# Patient Record
Sex: Female | Born: 1965 | Race: White | Hispanic: No | Marital: Married | Smoking: Never smoker
Health system: Southern US, Community
[De-identification: ages and names within clinical notes are randomized; demographics above are authoritative.]

## PROBLEM LIST (undated history)

## (undated) DIAGNOSIS — R112 Nausea with vomiting, unspecified: Secondary | ICD-10-CM

## (undated) DIAGNOSIS — K649 Unspecified hemorrhoids: Secondary | ICD-10-CM

## (undated) DIAGNOSIS — E119 Type 2 diabetes mellitus without complications: Secondary | ICD-10-CM

## (undated) DIAGNOSIS — Z9889 Other specified postprocedural states: Secondary | ICD-10-CM

## (undated) DIAGNOSIS — K921 Melena: Secondary | ICD-10-CM

## (undated) DIAGNOSIS — E079 Disorder of thyroid, unspecified: Secondary | ICD-10-CM

## (undated) HISTORY — DX: Disorder of thyroid, unspecified: E07.9

## (undated) HISTORY — DX: Unspecified hemorrhoids: K64.9

## (undated) HISTORY — DX: Melena: K92.1

## (undated) HISTORY — DX: Type 2 diabetes mellitus without complications: E11.9

## (undated) HISTORY — PX: OTHER SURGICAL HISTORY: SHX169

---

## 2000-09-24 ENCOUNTER — Other Ambulatory Visit: Admission: RE | Admit: 2000-09-24 | Discharge: 2000-09-24 | Payer: Self-pay | Admitting: *Deleted

## 2000-09-28 ENCOUNTER — Encounter: Admission: RE | Admit: 2000-09-28 | Discharge: 2000-09-28 | Payer: Self-pay | Admitting: Family Medicine

## 2000-09-28 ENCOUNTER — Encounter: Payer: Self-pay | Admitting: Family Medicine

## 2002-03-08 ENCOUNTER — Other Ambulatory Visit: Admission: RE | Admit: 2002-03-08 | Discharge: 2002-03-08 | Payer: Self-pay | Admitting: *Deleted

## 2010-04-22 ENCOUNTER — Emergency Department: Payer: Self-pay | Admitting: Emergency Medicine

## 2011-08-21 ENCOUNTER — Emergency Department (HOSPITAL_COMMUNITY)

## 2011-08-21 ENCOUNTER — Emergency Department (HOSPITAL_COMMUNITY)
Admission: EM | Admit: 2011-08-21 | Discharge: 2011-08-21 | Disposition: A | Discharge status: Home / Self Care | Attending: Emergency Medicine | Admitting: Emergency Medicine

## 2011-08-21 DIAGNOSIS — W292XXA Contact with other powered household machinery, initial encounter: Secondary | ICD-10-CM | POA: Insufficient documentation

## 2011-08-21 DIAGNOSIS — S62639B Displaced fracture of distal phalanx of unspecified finger, initial encounter for open fracture: Secondary | ICD-10-CM | POA: Insufficient documentation

## 2011-08-21 DIAGNOSIS — S61209A Unspecified open wound of unspecified finger without damage to nail, initial encounter: Secondary | ICD-10-CM | POA: Insufficient documentation

## 2011-08-28 NOTE — Consult Note (Signed)
NAME:  Alexis, Patel                ACCOUNT NO.:  [id number]  MEDICAL RECORD NO.:  [id number]  LOCATION:  [facility]                         FACILITY:  [facility]  PHYSICIAN:  Betha Loa, MD        DATE OF BIRTH:  20-Sep-1966  DATE OF CONSULTATION:  08/21/2011 DATE OF DISCHARGE:  08/21/2011                                CONSULTATION   Consult is from emergency department consult for left thumb tip lacerations and nail bed injury.  HISTORY:  Ms. Alexis Patel is a 45 year old left-hand dominant female who states she was chopping rosemary using a stick blender when she was trying to clean some rosemary off the blade and accidentally hit the button lacerating the left thumb.  She came to the emergency department, was evaluated and I was consulted for the management of injuries.  She reports no previous injuries to the left thumb with the exception of a laceration as a teen which had no residual issues.  She reports no other injuries at this time.  ALLERGIES:  No known drug allergies.  PAST MEDICAL HISTORY:  None.  PAST SURGICAL HISTORY:  None.  MEDICATIONS:  None.  FAMILY HISTORY:  Positive for diabetes, hypertension, cholesterol.  SOCIAL HISTORY:  Alexis Patel does not smoke and does not use alcohol.  REVIEW OF SYSTEMS:  Thirteen point review of systems negative.  PHYSICAL EXAMINATION:  VITAL SIGNS:  Temperature 98.6, pulse 98, respirations 16, BP 149/78. GENERAL:  She is alert and oriented x3, well developed, well nourished. She is resting comfortably in hospital stretcher. EXTREMITIES:  Bilateral upper extremities are intact to light touch sensation & capillary refill in all fingertips.  She can flex & extend the IP joint of her thumbs and cross her fingers.  Right upper extremity is without wounds, without tenderness to palpation.  Left upper extremity with the exception of thumb has no wounds, no tenderness to palpation. In the thumb, she has laceration on the pad of the finger as well as  one on the radial side going into the nail bed.  She has intact sensation, capillary refill distally.  There is no gross contamination.  She can flex & extend the IP joint of the thumb.  RADIOGRAPHS:  AP lateral and oblique views of the thumb show a small tuft fracture.  ASSESSMENT AND PLAN:  Left thumb tip laceration.  I discussed with Alexis Patel the nature of the injury.  I recommended repair of the lacerations under digital block in the emergency department.  Risks, benefits, and alternatives of the procedure were discussed including risk of blood loss, infection, damage to nerves, vessels, tendons, ligaments, bone, failure to procedure, need for additional procedures, complications with wound healing, continued pain, and nail deformity. She voiced understanding of these risks and elected to proceed.  PROCEDURE NOTE:  Digital block was performed with 10 mL of half and half solution of 2% plain lidocaine and 0.5% plain Marcaine.  This was adequate to give total digital anesthesia to the left thumb.  The thumb was prepped with Betadine and draped with sterile towels.  This had been copiously irrigated with a 1000 mL of sterile saline and Betadine solution.  The nail  was removed using a freer elevator.  The lacerations were repaired using 5-0 Monocryl suture in an interrupted fashion.  The nail bed was repaired using 6-0 chromic suture in an interrupted fashion.  This apposed all tissues well.  A piece of Xeroform was placed in the nail fold.  The wounds were all dressed with sterile Xeroform and 2 x 2's and wrapped with Kling and a Coban dressing.  Penrose drain has been used as a tourniquet was up for approximately 20 minutes.  I will give her Percocet 5/325 one to two p.o. q.6 hours p.r.n. pain dispensed #40 and Bactrim DS 1 p.o. b.i.d. x7 days.  She will be seen in follow up in the office in 1 week.     Betha Loa, MD     KK/MEDQ  D:  08/21/2011  T:  08/21/2011  Job:   [id number]  Electronically Signed by Betha Loa  on 08/28/2011 01:51:15 PM

## 2015-04-09 ENCOUNTER — Other Ambulatory Visit: Payer: Self-pay

## 2015-04-09 DIAGNOSIS — Z1231 Encounter for screening mammogram for malignant neoplasm of breast: Secondary | ICD-10-CM

## 2015-04-30 ENCOUNTER — Ambulatory Visit
Admission: RE | Admit: 2015-04-30 | Discharge: 2015-04-30 | Disposition: A | Discharge status: Home / Self Care | Source: Ambulatory Visit

## 2015-04-30 DIAGNOSIS — Z1231 Encounter for screening mammogram for malignant neoplasm of breast: Secondary | ICD-10-CM

## 2015-05-07 ENCOUNTER — Other Ambulatory Visit: Payer: Self-pay | Admitting: Nurse Practitioner

## 2015-05-07 DIAGNOSIS — R928 Other abnormal and inconclusive findings on diagnostic imaging of breast: Secondary | ICD-10-CM

## 2015-05-15 ENCOUNTER — Ambulatory Visit
Admission: RE | Admit: 2015-05-15 | Discharge: 2015-05-15 | Disposition: A | Discharge status: Home / Self Care | Source: Ambulatory Visit | Attending: Nurse Practitioner | Admitting: Nurse Practitioner

## 2015-05-15 DIAGNOSIS — R928 Other abnormal and inconclusive findings on diagnostic imaging of breast: Secondary | ICD-10-CM

## 2015-05-20 ENCOUNTER — Encounter

## 2015-05-20 ENCOUNTER — Other Ambulatory Visit

## 2015-11-27 ENCOUNTER — Other Ambulatory Visit: Payer: Self-pay | Admitting: Otolaryngology

## 2015-11-27 DIAGNOSIS — E049 Nontoxic goiter, unspecified: Secondary | ICD-10-CM

## 2015-12-02 ENCOUNTER — Ambulatory Visit
Admission: RE | Admit: 2015-12-02 | Discharge: 2015-12-02 | Disposition: A | Discharge status: Home / Self Care | Source: Ambulatory Visit | Attending: Otolaryngology | Admitting: Otolaryngology

## 2015-12-02 DIAGNOSIS — E049 Nontoxic goiter, unspecified: Secondary | ICD-10-CM

## 2016-04-13 ENCOUNTER — Other Ambulatory Visit: Payer: Self-pay | Admitting: Nurse Practitioner

## 2016-04-13 DIAGNOSIS — Z1231 Encounter for screening mammogram for malignant neoplasm of breast: Secondary | ICD-10-CM

## 2016-04-30 ENCOUNTER — Ambulatory Visit
Admission: RE | Admit: 2016-04-30 | Discharge: 2016-04-30 | Disposition: A | Discharge status: Home / Self Care | Source: Ambulatory Visit | Attending: Nurse Practitioner | Admitting: Nurse Practitioner

## 2016-04-30 DIAGNOSIS — Z1231 Encounter for screening mammogram for malignant neoplasm of breast: Secondary | ICD-10-CM

## 2016-06-07 ENCOUNTER — Encounter (HOSPITAL_COMMUNITY): Payer: Self-pay | Admitting: Emergency Medicine

## 2016-06-07 ENCOUNTER — Emergency Department (HOSPITAL_COMMUNITY)
Admission: EM | Admit: 2016-06-07 | Discharge: 2016-06-07 | Disposition: A | Discharge status: Home / Self Care | Attending: Emergency Medicine | Admitting: Emergency Medicine

## 2016-06-07 DIAGNOSIS — R109 Unspecified abdominal pain: Secondary | ICD-10-CM

## 2016-06-07 DIAGNOSIS — Y9289 Other specified places as the place of occurrence of the external cause: Secondary | ICD-10-CM | POA: Diagnosis not present

## 2016-06-07 DIAGNOSIS — W57XXXA Bitten or stung by nonvenomous insect and other nonvenomous arthropods, initial encounter: Secondary | ICD-10-CM | POA: Insufficient documentation

## 2016-06-07 DIAGNOSIS — Y9389 Activity, other specified: Secondary | ICD-10-CM | POA: Diagnosis not present

## 2016-06-07 DIAGNOSIS — Y999 Unspecified external cause status: Secondary | ICD-10-CM | POA: Insufficient documentation

## 2016-06-07 DIAGNOSIS — S30861A Insect bite (nonvenomous) of abdominal wall, initial encounter: Secondary | ICD-10-CM | POA: Insufficient documentation

## 2016-06-07 LAB — COMPREHENSIVE METABOLIC PANEL
ALBUMIN: 3.7 g/dL (ref 3.5–5.0)
ALT: 17 U/L (ref 14–54)
ANION GAP: 8 (ref 5–15)
AST: 17 U/L (ref 15–41)
Alkaline Phosphatase: 82 U/L (ref 38–126)
BUN: 11 mg/dL (ref 6–20)
CHLORIDE: 104 mmol/L (ref 101–111)
CO2: 25 mmol/L (ref 22–32)
Calcium: 9.1 mg/dL (ref 8.9–10.3)
Creatinine, Ser: 0.77 mg/dL (ref 0.44–1.00)
GFR calc non Af Amer: >60 mL/min (ref >60)
GLUCOSE: 138 mg/dL — AB (ref 65–99)
POTASSIUM: 3.3 mmol/L — AB (ref 3.5–5.1)
Sodium: 137 mmol/L (ref 135–145)
Total Bilirubin: 0.3 mg/dL (ref 0.3–1.2)
Total Protein: 7.7 g/dL (ref 6.5–8.1)

## 2016-06-07 LAB — CBC
HEMATOCRIT: 38.1 % (ref 36.0–46.0)
HEMOGLOBIN: 12.2 g/dL (ref 12.0–15.0)
MCH: 27.4 pg (ref 26.0–34.0)
MCHC: 32 g/dL (ref 30.0–36.0)
MCV: 85.4 fL (ref 78.0–100.0)
Platelets: 319 10*3/uL (ref 150–400)
RBC: 4.46 MIL/uL (ref 3.87–5.11)
RDW: 14 % (ref 11.5–15.5)
WBC: 9.8 10*3/uL (ref 4.0–10.5)

## 2016-06-07 LAB — URINALYSIS, ROUTINE W REFLEX MICROSCOPIC
BILIRUBIN URINE: NEGATIVE
Glucose, UA: NEGATIVE mg/dL
Ketones, ur: NEGATIVE mg/dL
Leukocytes, UA: NEGATIVE
Nitrite: NEGATIVE
PH: 6 (ref 5.0–8.0)
Protein, ur: 30 mg/dL — AB
SPECIFIC GRAVITY, URINE: 1.024 (ref 1.005–1.030)

## 2016-06-07 LAB — URINE MICROSCOPIC-ADD ON

## 2016-06-07 LAB — LIPASE, BLOOD: LIPASE: 20 U/L (ref 11–51)

## 2016-06-07 NOTE — ED Provider Notes (Signed)
[facility] Provider Note   CSN: [id number] Arrival date & time: 06/07/16  4098  First Provider Contact:  First MD Initiated Contact with Patient 06/07/16 0631        History   Chief Complaint Chief Complaint  Patient presents with  . Abdominal Pain    HPI Alexis Patel is a 50 y.o. Otherwise healthy female, who presents to the ED with complaints of 12 hours of right upper quadrant abdominal pain. She describes the pain as 1/10 constant aching nonradiating pain with no known aggravating or alleviating factors given that she has not tried anything for pain. She reports associated symptoms including a swollen area to the skin of the right upper quadrant region. She states the symptoms began around 6 PM, and reports that she had been mowing her lawn yesterday and is unsure whether she may have been bit by an insect. She states the skin is mildly pink but denies any erythema. Denies any skin warmth, rashes, itching, fevers, chills, chest pain, shortness breath, nausea, vomiting, diarrhea, constipation, obstipation, melena, hematochezia, dysuria, hematuria, vaginal discharge, numbness, tingling, weakness, recent travel, sick contacts, suspicious food intake, alcohol use, or chronic NSAID use. No prior abd surgeries. Currently on menses.    The history is provided by the patient and medical records. No language interpreter was used.  Abdominal Pain   This is a new problem. The current episode started 12 to 24 hours ago. The problem occurs constantly. The problem has not changed since onset.The pain is associated with an unknown factor. The pain is located in the RUQ. The pain is at a severity of 1/10. The pain is mild. Pertinent negatives include fever, diarrhea, flatus, hematochezia, melena, nausea, vomiting, constipation, dysuria, hematuria, arthralgias and myalgias. Nothing aggravates the symptoms. Nothing relieves the symptoms.    History reviewed. No pertinent past medical  history.  There are no active problems to display for this patient.   History reviewed. No pertinent surgical history.  OB History    No data available       Home Medications    Prior to Admission medications   Not on File    Family History No family history on file.  Social History Social History  Substance Use Topics  . Smoking status: Never Smoker  . Smokeless tobacco: Not on file  . Alcohol use No     Allergies   Codeine   Review of Systems Review of Systems  Constitutional: Negative for chills and fever.  Respiratory: Negative for shortness of breath.   Cardiovascular: Negative for chest pain.  Gastrointestinal: Positive for abdominal pain. Negative for blood in stool, constipation, diarrhea, flatus, hematochezia, melena, nausea and vomiting.  Genitourinary: Negative for dysuria, hematuria, vaginal bleeding and vaginal discharge.  Musculoskeletal: Negative for arthralgias and myalgias.  Skin: Positive for color change (pinkish swollen area to R lateral abdomen). Negative for rash and wound.  Allergic/Immunologic: Negative for immunocompromised state.  Neurological: Negative for weakness and numbness.  Psychiatric/Behavioral: Negative for confusion.   10 Systems reviewed and are negative for acute change except as noted in the HPI.   Physical Exam Updated Vital Signs BP 169/88 (BP Location: Left Arm)   Pulse 93   Temp 98.2 F (36.8 C) (Oral)   Resp 16   Ht  (1.6 m)   Wt 107.5 kg   SpO2 99%   BMI 41.98 kg/m   Physical Exam  Constitutional: She is oriented to person, place, and time. Vital signs are normal. She appears  well-developed and well-nourished.  Non-toxic appearance. No distress.  Afebrile, nontoxic, NAD  HENT:  Head: Normocephalic and atraumatic.  Mouth/Throat: Oropharynx is clear and moist and mucous membranes are normal.  Eyes: Conjunctivae and EOM are normal. Right eye exhibits no discharge. Left eye exhibits no discharge.   Neck: Normal range of motion. Neck supple.  Cardiovascular: Normal rate, regular rhythm, normal heart sounds and intact distal pulses.  Exam reveals no gallop and no friction rub.   No murmur heard. Pulmonary/Chest: Effort normal and breath sounds normal. No respiratory distress. She has no decreased breath sounds. She has no wheezes. She has no rhonchi. She has no rales.  Abdominal: Soft. Normal appearance and bowel sounds are normal. She exhibits no distension. There is no tenderness. There is no rigidity, no rebound, no guarding, no CVA tenderness, no tenderness at McBurney's point and negative Murphy's sign.    Soft, NTND, +BS throughout, no r/g/r, neg murphy's, neg mcburney's, no CVA TTP Small area of pinkish red skin that appears mildly swollen over the R lateral abdomen just below the costal margin, no induration or warmth, no cellulitic changes, no abscesses  Musculoskeletal: Normal range of motion.  Neurological: She is alert and oriented to person, place, and time. She has normal strength. No sensory deficit.  Skin: Skin is warm, dry and intact. No rash noted.  R lateral abdomen skin swelling/erythema as noted above  Psychiatric: She has a normal mood and affect.  Nursing note and vitals reviewed.    ED Treatments / Results  Labs (all labs ordered are listed, but only abnormal results are displayed) Labs Reviewed  COMPREHENSIVE METABOLIC PANEL - Abnormal; Notable for the following:       Result Value   Potassium 3.3 (*)    Glucose, Bld 138 (*)    All other components within normal limits  URINALYSIS, ROUTINE W REFLEX MICROSCOPIC (NOT AT [facility]) - Abnormal; Notable for the following:    Hgb urine dipstick MODERATE (*)    Protein, ur 30 (*)    All other components within normal limits  URINE MICROSCOPIC-ADD ON - Abnormal; Notable for the following:    Squamous Epithelial / LPF 0-5 (*)    Bacteria, UA RARE (*)    Crystals CA OXALATE CRYSTALS (*)    All other components  within normal limits  LIPASE, BLOOD  CBC    EKG  EKG Interpretation  Date/Time:  Sunday June 07 2016 02:49:47 EDT Ventricular Rate:  86 PR Interval:  144 QRS Duration: 82 QT Interval:  390 QTC Calculation: 466 R Axis:   56 Text Interpretation:  Normal sinus rhythm Cannot rule out Anterior infarct , age undetermined Abnormal ECG No old tracing to compare Confirmed by [facility]  MD, DAVID ([id number]) on 06/07/2016 6:32:19 AM       Radiology No results found.  Procedures Procedures (including critical care time)  Medications Ordered in ED Medications - No data to display   Initial Impression / Assessment and Plan / ED Course  I have reviewed the triage vital signs and the nursing notes.  Pertinent labs & imaging results that were available during my care of the patient were reviewed by me and considered in my medical decision making (see chart for details).  Clinical Course    50 y.o. female here with RUQ/R lateral abd pain x12 hrs and swollen area. Unknown if insect bit her, states she was mowing the lawn yesterday. Area mildly pinkish red, no warmth, no cellulitic appearance. No tenderness on  exam. Neg murphy's. Labs unremarkable. Appears to be superficial area of possible allergic reaction to an insect bite, doubt cellulitis or superimposed infection. Discussed symptomatic care at home, close PCP f/up in 3 days to check area for s/sx of infection. Discussed strict return precautions regarding worsening infection symptoms, or allergic rxn symptoms. Airway intact, NAD, doubt need for further work up/evaluation emergently. I explained the diagnosis and have given explicit precautions to return to the ER including for any other new or worsening symptoms. The patient understands and accepts the medical plan as it's been dictated and I have answered their questions. Discharge instructions concerning home care and prescriptions have been given. The patient is STABLE and is discharged to home in  good condition.   Final Clinical Impressions(s) / ED Diagnoses   Final diagnoses:  Right lateral abdominal pain  Insect bite    New Prescriptions New Prescriptions   No medications on file     Mason Camprubi-Soms, PA-C 06/07/16 4098    Dione Booze, MD 06/07/16 [phone]

## 2016-06-07 NOTE — Discharge Instructions (Signed)
Use tylenol and motrin as needed for pain. Use antihistamines like benadryl/zyrtec/claritin/etc to help with itching/swelling. Follow up with your regular doctor in 3 days for recheck of symptoms. Use a warm compress to the area to help with pain/swelling. Return to the ER for changes or worsening symptoms.

## 2016-06-07 NOTE — ED Triage Notes (Signed)
Pt. reports right upper abdominal discomfort / mild aches onset last night , denies  injury , no emesis or fever .

## 2017-06-15 ENCOUNTER — Ambulatory Visit (INDEPENDENT_AMBULATORY_CARE_PROVIDER_SITE_OTHER): Admitting: Primary Care

## 2017-06-15 ENCOUNTER — Encounter: Payer: Self-pay | Admitting: Primary Care

## 2017-06-15 VITALS — BP 148/86 | HR 90 | Temp 98.2°F | Ht 63.0 in | Wt 228.8 lb

## 2017-06-15 DIAGNOSIS — Z1231 Encounter for screening mammogram for malignant neoplasm of breast: Secondary | ICD-10-CM | POA: Diagnosis not present

## 2017-06-15 DIAGNOSIS — E049 Nontoxic goiter, unspecified: Secondary | ICD-10-CM

## 2017-06-15 DIAGNOSIS — R030 Elevated blood-pressure reading, without diagnosis of hypertension: Secondary | ICD-10-CM

## 2017-06-15 DIAGNOSIS — Z1239 Encounter for other screening for malignant neoplasm of breast: Secondary | ICD-10-CM

## 2017-06-15 LAB — COMPREHENSIVE METABOLIC PANEL
ALT: 25 U/L (ref 0–35)
AST: 21 U/L (ref 0–37)
Albumin: 4.2 g/dL (ref 3.5–5.2)
Alkaline Phosphatase: 81 U/L (ref 39–117)
BILIRUBIN TOTAL: 0.4 mg/dL (ref 0.2–1.2)
BUN: 10 mg/dL (ref 6–23)
CO2: 25 meq/L (ref 19–32)
Calcium: 9.4 mg/dL (ref 8.4–10.5)
Chloride: 100 mEq/L (ref 96–112)
Creatinine, Ser: 0.7 mg/dL (ref 0.40–1.20)
GFR: 93.07 mL/min (ref >60.00)
GLUCOSE: 105 mg/dL — AB (ref 70–99)
Potassium: 3.7 mEq/L (ref 3.5–5.1)
Sodium: 134 mEq/L — ABNORMAL LOW (ref 135–145)
Total Protein: 8.4 g/dL — ABNORMAL HIGH (ref 6.0–8.3)

## 2017-06-15 LAB — T4, FREE: FREE T4: 1.05 ng/dL (ref 0.60–1.60)

## 2017-06-15 LAB — TSH: TSH: 1.37 u[IU]/mL (ref 0.35–4.50)

## 2017-06-15 NOTE — Patient Instructions (Signed)
Complete lab work prior to leaving today. I will notify you of your results once received.   Start monitoring your blood pressure several times weekly, around the same time of day. Ensure that you have rested for 30 minutes prior to checking your blood pressure. Record your readings and bring them to your next visit.  Please schedule a physical with me in 2 months. You may also schedule a lab only appointment 3-4 days prior. We will discuss your lab results in detail during your physical.  It was a pleasure to meet you today! Please don't hesitate to call me with any questions. Welcome to [facility]!   DASH Eating Plan DASH stands for "Dietary Approaches to Stop Hypertension." The DASH eating plan is a healthy eating plan that has been shown to reduce high blood pressure (hypertension). It may also reduce your risk for type 2 diabetes, heart disease, and stroke. The DASH eating plan may also help with weight loss. What are tips for following this plan? General guidelines  Avoid eating more than 2,300 mg (milligrams) of salt (sodium) a day. If you have hypertension, you may need to reduce your sodium intake to 1,500 mg a day.  Limit alcohol intake to no more than 1 drink a day for nonpregnant women and 2 drinks a day for men. One drink equals 12 oz of beer, 5 oz of wine, or 1 oz of hard liquor.  Work with your health care provider to maintain a healthy body weight or to lose weight. Ask what an ideal weight is for you.  Get at least 30 minutes of exercise that causes your heart to beat faster (aerobic exercise) most days of the week. Activities may include walking, swimming, or biking.  Work with your health care provider or diet and nutrition specialist (dietitian) to adjust your eating plan to your individual calorie needs. Reading food labels  Check food labels for the amount of sodium per serving. Choose foods with less than 5 percent of the Daily Value of sodium. Generally, foods with  less than 300 mg of sodium per serving fit into this eating plan.  To find whole grains, look for the word "whole" as the first word in the ingredient list. Shopping  Buy products labeled as "low-sodium" or "no salt added."  Buy fresh foods. Avoid canned foods and premade or frozen meals. Cooking  Avoid adding salt when cooking. Use salt-free seasonings or herbs instead of table salt or sea salt. Check with your health care provider or pharmacist before using salt substitutes.  Do not fry foods. Cook foods using healthy methods such as baking, boiling, grilling, and broiling instead.  Cook with heart-healthy oils, such as olive, canola, soybean, or sunflower oil. Meal planning   Eat a balanced diet that includes: ? 5 or more servings of fruits and vegetables each day. At each meal, try to fill half of your plate with fruits and vegetables. ? Up to 6-8 servings of whole grains each day. ? Less than 6 oz of lean meat, poultry, or fish each day. A 3-oz serving of meat is about the same size as a deck of cards. One egg equals 1 oz. ? 2 servings of low-fat dairy each day. ? A serving of nuts, seeds, or beans 5 times each week. ? Heart-healthy fats. Healthy fats called Omega-3 fatty acids are found in foods such as flaxseeds and coldwater fish, like sardines, salmon, and mackerel.  Limit how much you eat of the following: ? Canned  or prepackaged foods. ? Food that is high in trans fat, such as fried foods. ? Food that is high in saturated fat, such as fatty meat. ? Sweets, desserts, sugary drinks, and other foods with added sugar. ? Full-fat dairy products.  Do not salt foods before eating.  Try to eat at least 2 vegetarian meals each week.  Eat more home-cooked food and less restaurant, buffet, and fast food.  When eating at a restaurant, ask that your food be prepared with less salt or no salt, if possible. What foods are recommended? The items listed may not be a complete list.  Talk with your dietitian about what dietary choices are best for you. Grains Whole-grain or whole-wheat bread. Whole-grain or whole-wheat pasta. Brown rice. Orpah Cobb. Bulgur. Whole-grain and low-sodium cereals. Pita bread. Low-fat, low-sodium crackers. Whole-wheat flour tortillas. Vegetables Fresh or frozen vegetables (raw, steamed, roasted, or grilled). Low-sodium or reduced-sodium tomato and vegetable juice. Low-sodium or reduced-sodium tomato sauce and tomato paste. Low-sodium or reduced-sodium canned vegetables. Fruits All fresh, dried, or frozen fruit. Canned fruit in natural juice (without added sugar). Meat and other protein foods Skinless chicken or [place]. Ground chicken or [place]. Pork with fat trimmed off. Fish and seafood. Egg whites. Dried beans, peas, or lentils. Unsalted nuts, nut butters, and seeds. Unsalted canned beans. Lean cuts of beef with fat trimmed off. Low-sodium, lean deli meat. Dairy Low-fat (1%) or fat-free (skim) milk. Fat-free, low-fat, or reduced-fat cheeses. Nonfat, low-sodium ricotta or cottage cheese. Low-fat or nonfat yogurt. Low-fat, low-sodium cheese. Fats and oils Soft margarine without trans fats. Vegetable oil. Low-fat, reduced-fat, or light mayonnaise and salad dressings (reduced-sodium). Canola, safflower, olive, soybean, and sunflower oils. Avocado. Seasoning and other foods Herbs. Spices. Seasoning mixes without salt. Unsalted popcorn and pretzels. Fat-free sweets. What foods are not recommended? The items listed may not be a complete list. Talk with your dietitian about what dietary choices are best for you. Grains Baked goods made with fat, such as croissants, muffins, or some breads. Dry pasta or rice meal packs. Vegetables Creamed or fried vegetables. Vegetables in a cheese sauce. Regular canned vegetables (not low-sodium or reduced-sodium). Regular canned tomato sauce and paste (not low-sodium or reduced-sodium). Regular tomato and vegetable  juice (not low-sodium or reduced-sodium). Rosita Fire. Olives. Fruits Canned fruit in a light or heavy syrup. Fried fruit. Fruit in cream or butter sauce. Meat and other protein foods Fatty cuts of meat. Ribs. Fried meat. Tomasa Blase. Sausage. Bologna and other processed lunch meats. Salami. Fatback. Hotdogs. Bratwurst. Salted nuts and seeds. Canned beans with added salt. Canned or smoked fish. Whole eggs or egg yolks. Chicken or [place] with skin. Dairy Whole or 2% milk, cream, and half-and-half. Whole or full-fat cream cheese. Whole-fat or sweetened yogurt. Full-fat cheese. Nondairy creamers. Whipped toppings. Processed cheese and cheese spreads. Fats and oils Butter. Stick margarine. Lard. Shortening. Ghee. Bacon fat. Tropical oils, such as coconut, palm kernel, or palm oil. Seasoning and other foods Salted popcorn and pretzels. Onion salt, garlic salt, seasoned salt, table salt, and sea salt. Worcestershire sauce. Tartar sauce. Barbecue sauce. Teriyaki sauce. Soy sauce, including reduced-sodium. Steak sauce. Canned and packaged gravies. Fish sauce. Oyster sauce. Cocktail sauce. Horseradish that you find on the shelf. Ketchup. Mustard. Meat flavorings and tenderizers. Bouillon cubes. Hot sauce and Tabasco sauce. Premade or packaged marinades. Premade or packaged taco seasonings. Relishes. Regular salad dressings. Where to find more information:  National Heart, Lung, and Blood Institute: PopSteam.is  American Heart Association: www.heart.org Summary  The  DASH eating plan is a healthy eating plan that has been shown to reduce high blood pressure (hypertension). It may also reduce your risk for type 2 diabetes, heart disease, and stroke.  With the DASH eating plan, you should limit salt (sodium) intake to 2,300 mg a day. If you have hypertension, you may need to reduce your sodium intake to 1,500 mg a day.  When on the DASH eating plan, aim to eat more fresh fruits and vegetables, whole grains,  lean proteins, low-fat dairy, and heart-healthy fats.  Work with your health care provider or diet and nutrition specialist (dietitian) to adjust your eating plan to your individual calorie needs. This information is not intended to replace advice given to you by your health care provider. Make sure you discuss any questions you have with your health care provider. Document Released: 10/15/2011 Document Revised: 10/19/2016 Document Reviewed: 10/19/2016 Elsevier Interactive Patient Education  2017 [employer].

## 2017-06-15 NOTE — Progress Notes (Signed)
   Subjective:    Patient ID: Real Alexis Patel, female    DOB: 02-15-1966, 51 y.o.   MRN: [id number]  HPI  Alexis Patel is a 51 year old female who presents today to establish care and discuss the problems mentioned below. Will obtain old records. Her last physical was several years ago. She is due for a mammogram, family history of breast cancer in her mother.  1) Enlarged Thyroid: Evaluated per her ENT several weeks ago who noticed that her thyroid was enlarged. This was evident on ultrasound. She has no history of hypothyroidism and has never had labs. Family history of Hashimoto's Thyroiditis in both sisters. She's noticed fullness to her throat over the past 10 years. She denies hair loss, palpitations, cold intolerance. She does endorse fatigue, brittle nails.   2) Elevated Blood Pressure Reading: Endorses elevated readings during last several sick visits, most of these visits were for acute sinusitis. She's not check her BP at home. She denies dizziness, chest pain, visual changes. Her husband does have a blood pressure cuff. She has recently lost 8 pounds through a new low carbohydrate diet.   Review of Systems  Constitutional: Positive for fatigue.  Eyes: Negative for visual disturbance.  Respiratory: Negative for shortness of breath.   Cardiovascular: Negative for chest pain.  Endocrine: Negative for cold intolerance.       Thyroid enlargement  Neurological: Negative for dizziness and headaches.       Past Medical History:  Diagnosis Date  . Blood in stool   . Hemorrhoids   . Thyroid disease      Social History   Social History  . Marital status: Married    Spouse name: N/A  . Number of children: N/A  . Years of education: N/A   Occupational History  . Not on file.   Social History Main Topics  . Smoking status: Never Smoker  . Smokeless tobacco: Never Used  . Alcohol use No  . Drug use: No  . Sexual activity: Not on file   Other Topics Concern  . Not on file    Social History Narrative   Married.   2 children. 6 grandchildren.   Works as a Futures traderhomemaker.   Enjoys making handmade soaps, spending time with family.     No past surgical history on file.  Family History  Problem Relation Age of Onset  . Breast cancer Mother   . Arthritis Father   . Hyperlipidemia Father   . Stroke Father   . Hypertension Father   . Arthritis Sister   . Hyperlipidemia Sister   . Diabetes Sister     Allergies  Allergen Reactions  . Codeine     No current outpatient prescriptions on file prior to visit.   No current facility-administered medications on file prior to visit.     BP (!) 148/86   Pulse 90   Temp 98.2 F (36.8 C) (Oral)   Ht 5\' 3"  (1.6 m)   Wt 228 lb 12.8 oz (103.8 kg)   LMP 06/11/2017   SpO2 98%   BMI 40.53 kg/m    Objective:   Physical Exam  Constitutional: She appears well-nourished.  Neck: Neck supple. Thyromegaly present.  Cardiovascular: Normal rate and regular rhythm.   Pulmonary/Chest: Effort normal and breath sounds normal.  Skin: Skin is warm and dry.  Psychiatric: She has a normal mood and affect.          Assessment & Plan:

## 2017-06-15 NOTE — Assessment & Plan Note (Signed)
Above goal today, also on sick visits in the past. Will have her start monitoring BP at home, record readings, and bring them to her next appointment. Commended her on weight loss, recommended she continue. Information provided regarding DASH diet. If no improvement in BP in 2 months then will consider medication.

## 2017-06-15 NOTE — Assessment & Plan Note (Signed)
Patient endorses this was noted per ENT via ultrasound. Will check TSH, free T4. Strong family history of Hashimoto's Thyroiditis.

## 2017-06-16 ENCOUNTER — Encounter: Payer: Self-pay | Admitting: *Deleted

## 2017-08-05 ENCOUNTER — Other Ambulatory Visit: Payer: Self-pay | Admitting: Primary Care

## 2017-08-05 DIAGNOSIS — Z0000 Encounter for general adult medical examination without abnormal findings: Secondary | ICD-10-CM

## 2017-08-13 ENCOUNTER — Other Ambulatory Visit

## 2017-08-17 ENCOUNTER — Encounter: Admitting: Primary Care

## 2017-09-16 ENCOUNTER — Other Ambulatory Visit: Payer: Self-pay

## 2017-09-16 ENCOUNTER — Encounter (HOSPITAL_COMMUNITY): Payer: Self-pay

## 2017-09-16 ENCOUNTER — Emergency Department (HOSPITAL_COMMUNITY)

## 2017-09-16 ENCOUNTER — Emergency Department (HOSPITAL_COMMUNITY)
Admission: EM | Admit: 2017-09-16 | Discharge: 2017-09-16 | Disposition: A | Discharge status: Home / Self Care | Attending: Physician Assistant | Admitting: Physician Assistant

## 2017-09-16 DIAGNOSIS — R110 Nausea: Secondary | ICD-10-CM | POA: Insufficient documentation

## 2017-09-16 DIAGNOSIS — Z79899 Other long term (current) drug therapy: Secondary | ICD-10-CM | POA: Diagnosis not present

## 2017-09-16 DIAGNOSIS — R1011 Right upper quadrant pain: Secondary | ICD-10-CM | POA: Diagnosis present

## 2017-09-16 DIAGNOSIS — K8020 Calculus of gallbladder without cholecystitis without obstruction: Secondary | ICD-10-CM

## 2017-09-16 LAB — URINALYSIS, ROUTINE W REFLEX MICROSCOPIC
BILIRUBIN URINE: NEGATIVE
Glucose, UA: NEGATIVE mg/dL
Hgb urine dipstick: NEGATIVE
Ketones, ur: 20 mg/dL — AB
LEUKOCYTES UA: NEGATIVE
NITRITE: NEGATIVE
PROTEIN: NEGATIVE mg/dL
Specific Gravity, Urine: 1.003 — ABNORMAL LOW (ref 1.005–1.030)
pH: 6 (ref 5.0–8.0)

## 2017-09-16 LAB — CBC
HCT: 39.6 % (ref 36.0–46.0)
Hemoglobin: 13.2 g/dL (ref 12.0–15.0)
MCH: 27.8 pg (ref 26.0–34.0)
MCHC: 33.3 g/dL (ref 30.0–36.0)
MCV: 83.5 fL (ref 78.0–100.0)
PLATELETS: 277 10*3/uL (ref 150–400)
RBC: 4.74 MIL/uL (ref 3.87–5.11)
RDW: 14.6 % (ref 11.5–15.5)
WBC: 16 10*3/uL — AB (ref 4.0–10.5)

## 2017-09-16 LAB — I-STAT BETA HCG BLOOD, ED (MC, WL, AP ONLY): I-stat hCG, quantitative: <5 m[IU]/mL (ref <5)

## 2017-09-16 LAB — COMPREHENSIVE METABOLIC PANEL
ALT: 21 U/L (ref 14–54)
AST: 17 U/L (ref 15–41)
Albumin: 3.8 g/dL (ref 3.5–5.0)
Alkaline Phosphatase: 93 U/L (ref 38–126)
Anion gap: 8 (ref 5–15)
BILIRUBIN TOTAL: 0.9 mg/dL (ref 0.3–1.2)
BUN: 6 mg/dL (ref 6–20)
CO2: 22 mmol/L (ref 22–32)
CREATININE: 0.64 mg/dL (ref 0.44–1.00)
Calcium: 9.2 mg/dL (ref 8.9–10.3)
Chloride: 104 mmol/L (ref 101–111)
GFR calc Af Amer: >60 mL/min (ref >60)
Glucose, Bld: 131 mg/dL — ABNORMAL HIGH (ref 65–99)
Potassium: 3.5 mmol/L (ref 3.5–5.1)
Sodium: 134 mmol/L — ABNORMAL LOW (ref 135–145)
TOTAL PROTEIN: 8 g/dL (ref 6.5–8.1)

## 2017-09-16 LAB — LIPASE, BLOOD: Lipase: 19 U/L (ref 11–51)

## 2017-09-16 MED ORDER — OXYCODONE-ACETAMINOPHEN 5-325 MG PO TABS: 1.0000 | ORAL_TABLET | Freq: Four times a day (QID) | ORAL | 0 refills | Status: DC | PRN

## 2017-09-16 MED ORDER — ONDANSETRON 4 MG PO TBDP
4.0000 mg | ORAL_TABLET | Freq: Three times a day (TID) | ORAL | 0 refills | Status: DC | PRN
Start: 2017-09-16 — End: 2019-01-26

## 2017-09-16 MED ORDER — OXYCODONE-ACETAMINOPHEN 5-325 MG PO TABS
1.0000 | ORAL_TABLET | Freq: Once | ORAL | Status: AC
  Administered 2017-09-16: 1 via ORAL
  Filled 2017-09-16: qty 1

## 2017-09-16 MED ORDER — ONDANSETRON HCL 4 MG PO TABS
4.0000 mg | ORAL_TABLET | Freq: Once | ORAL | Status: AC
  Administered 2017-09-16: 4 mg via ORAL
  Filled 2017-09-16: qty 1

## 2017-09-16 NOTE — ED Provider Notes (Signed)
[facility] EMERGENCY DEPARTMENT Provider Note   CSN: [id number] Arrival date & time: 09/16/17  1132     History   Chief Complaint Chief Complaint  Patient presents with  . Abdominal Pain    HPI Real Consmelia Bump is a 51 y.o. female.  HPI   Patient is a 51 year old female presenting with right upper quadrant pain.  She reports it happened last night about 3 hours after she ate dinner at a church event.  Patient reports that its mild and constant.  She has not had much of an appetite today.  Patient googled her symptoms and think she has a problem with her gallbladder.  Patient had no nausea no vomiting no fevers no diarrhea.  Past Medical History:  Diagnosis Date  . Blood in stool   . Hemorrhoids   . Thyroid disease     Patient Active Problem List   Diagnosis Date Noted  . Enlarged thyroid 06/15/2017  . Elevated blood pressure reading 06/15/2017    History reviewed. No pertinent surgical history.  OB History    No data available       Home Medications    Prior to Admission medications   Medication Sig Start Date End Date Taking? Authorizing Provider  ibuprofen (ADVIL,MOTRIN) 200 MG tablet Take 800 mg every 6 (six) hours as needed by mouth.   Yes [provider]  MAGNESIUM CITRATE PO Take 1 tablet daily by mouth.   Yes [provider]  OVER THE COUNTER MEDICATION Take 1 packet daily by mouth. Plexus power   Yes [provider]    Family History Family History  Problem Relation Age of Onset  . Breast cancer Mother   . Arthritis Father   . Hyperlipidemia Father   . Stroke Father   . Hypertension Father   . Arthritis Sister   . Hyperlipidemia Sister   . Diabetes Sister     Social History Social History   Tobacco Use  . Smoking status: Never Smoker  . Smokeless tobacco: Never Used  Substance Use Topics  . Alcohol use: No  . Drug use: No     Allergies   Codeine   Review of Systems Review of Systems    Constitutional: Negative for activity change.  Respiratory: Negative for shortness of breath.   Cardiovascular: Negative for chest pain.  Gastrointestinal: Positive for abdominal pain and nausea. Negative for vomiting.     Physical Exam Updated Vital Signs BP 139/69   Pulse 82   Temp 98.6 F (37 C) (Oral)   Resp 16   Ht 5\' 3"  (1.6 m)   Wt 103.4 kg (228 lb)   LMP 07/19/2017   SpO2 99%   BMI 40.39 kg/m   Physical Exam  Constitutional: She is oriented to person, place, and time. She appears well-developed and well-nourished.  HENT:  Head: Normocephalic and atraumatic.  Eyes: Right eye exhibits no discharge.  Cardiovascular: Normal rate, regular rhythm and normal heart sounds.  No murmur heard. Pulmonary/Chest: Effort normal and breath sounds normal. She has no wheezes. She has no rales.  Abdominal: Soft. She exhibits no distension. There is tenderness in the right upper quadrant.  Neurological: She is oriented to person, place, and time.  Skin: Skin is warm and dry. She is not diaphoretic.  Psychiatric: She has a normal mood and affect.  Nursing note and vitals reviewed.    ED Treatments / Results  Labs (all labs ordered are listed, but only abnormal results are displayed)  Labs Reviewed  COMPREHENSIVE METABOLIC PANEL - Abnormal; Notable for the following components:      Result Value   Sodium 134 (*)    Glucose, Bld 131 (*)    All other components within normal limits  CBC - Abnormal; Notable for the following components:   WBC 16.0 (*)    All other components within normal limits  URINALYSIS, ROUTINE W REFLEX MICROSCOPIC - Abnormal; Notable for the following components:   Color, Urine STRAW (*)    Specific Gravity, Urine 1.003 (*)    Ketones, ur 20 (*)    All other components within normal limits  LIPASE, BLOOD  I-STAT BETA HCG BLOOD, ED ([facility], [facility], [facility] ONLY)    EKG  EKG Interpretation None       Radiology [place] Abdomen Limited Ruq  Result Date:  09/16/2017 CLINICAL DATA:  Right upper quadrant pain since last night. EXAM: ULTRASOUND ABDOMEN LIMITED RIGHT UPPER QUADRANT COMPARISON:  None. FINDINGS: Gallbladder: Single 10 mm gallstone. No gallbladder wall thickening, pericholecystic fluid or other signs of acute cholecystitis. No sonographic Murphy's sign elicited during the exam. Common bile duct: Diameter: Normal at 3 mm Liver: No focal lesion identified. Within normal limits in parenchymal echogenicity. Portal vein is patent on color Doppler imaging with normal direction of blood flow towards the liver. IMPRESSION: 1. No acute findings. 2. Cholelithiasis without evidence of acute cholecystitis. No bile duct dilatation. 3. Liver is unremarkable. Electronically Signed   By: Bary RichardStan  Maynard M.D.   On: 09/16/2017 17:57    Procedures Procedures (including critical care time)  Medications Ordered in ED Medications - No data to display   Initial Impression / Assessment and Plan / ED Course  I have reviewed the triage vital signs and the nursing notes.  Pertinent labs & imaging results that were available during my care of the patient were reviewed by me and considered in my medical decision making (see chart for details).     Patient is a 51 year old female presenting with right upper quadrant pain.  She reports it happened last night about 3 hours after she ate dinner at a church event.  Patient reports that its mild and constant.  She has not had much of an appetite today.  Patient googled her symptoms and think she has a problem with her gallbladder.  Patient had no nausea no vomiting no fevers no diarrhea.  7:05 PM Patient's ultrasound shows cholelithiasis without evidence of infection.  Will give pain control, nausea control, have patient follow-up as an outpatient with surgery.  Return precautions expressed such as fever, worsening pain.    Final Clinical Impressions(s) / ED Diagnoses   Final diagnoses:  RUQ pain    ED Discharge  Orders    None       Rhyan Wolters, Cindee Saltourteney Lyn, MD 09/17/17 0009

## 2017-09-16 NOTE — ED Notes (Signed)
Pt given water and crackers for PO challenge. Pt tolerating well. Will continue to monitor.

## 2017-09-16 NOTE — ED Triage Notes (Signed)
Pt reports RUQ pain that began last night around 9 pm after eating a cheeseburger without a bun. Pt endorses nausea and diarrhea. Denies vomiting

## 2017-09-16 NOTE — ED Notes (Signed)
Pt verbalizes understanding of d/c instructions. Pt received prescriptions. Pt ambulatory at d/c with all belongings.  

## 2017-09-16 NOTE — Discharge Instructions (Signed)
You have gallstones in your gallbladder.  As we discussed these are noit inherently dangerous, but they can be painful.  If you develop fever, worsening pain please return to the emergency department as it may be an emergency and he may need to have your gallbladder taken out.  Otherwise please call the number given to schedule an outpatient surgery.  We give any pain management and nausea medicine.  Please follow the dietary restrictions as well.

## 2017-10-05 ENCOUNTER — Ambulatory Visit: Payer: Self-pay | Admitting: General Surgery

## 2017-10-25 NOTE — Patient Instructions (Signed)
Real Alexis Patel  10/25/2017   Your procedure is scheduled on: 10-28-17  [facility]  [place]  elevators to 3rd floor to  Short Stay Center at [facility].   Call this number if you have problems the morning of surgery [phone]   Remember: ONLY 1 PERSON MAY GO WITH YOU TO SHORT STAY TO GET  READY MORNING OF YOUR SURGERY.  Do not eat food or drink liquids :After Midnight.     Take these medicines the morning of surgery with A SIP OF WATER: none                                You may not have any metal on your body including hair pins and              piercings  Do not wear jewelry, make-up, lotions, powders or perfumes, deodorant             Do not wear nail polish.  Do not shave  48 hours prior to surgery.       Do not bring valuables to the hospital. [facility] IS NOT             RESPONSIBLE   FOR VALUABLES.  Contacts, dentures or bridgework may not be worn into surgery.     Patients discharged the day of surgery will not be allowed to drive home.  Name and phone number of your driver:  Special Instructions: N/A              Please read over the following fact sheets you were given: _____________________________________________________________________             [facility] - Preparing [facility] Before surgery, you can play an important role.  Because skin is not sterile, your skin needs to be as free of germs as possible.  You can reduce the number of germs on your skin by washing with CHG (chlorahexidine gluconate) soap before surgery.  CHG is an antiseptic cleaner which kills germs and bonds with the skin to continue killing germs even after washing. Please DO NOT use if you have an allergy to CHG or antibacterial soaps.  If your skin becomes reddened/irritated stop using the CHG and inform your nurse when you arrive at Short Stay. Do not shave (including legs and underarms) for at least 48 hours prior to the first CHG shower.   You may shave your face/neck. Please follow these instructions carefully:  1.  Shower with CHG Soap the night before surgery and the  morning of Surgery.  2.  If you choose to wash your hair, wash your hair first as usual with your  normal  shampoo.  3.  After you shampoo, rinse your hair and body thoroughly to remove the  shampoo.                           4.  Use CHG as you would any other liquid soap.  You can apply chg directly  to the skin and wash                       Gently with a scrungie or clean washcloth.  5.  Apply the CHG Soap to your body ONLY FROM THE  NECK DOWN.   Do not use on face/ open                           Wound or open sores. Avoid contact with eyes, ears mouth and genitals (private parts).                       Wash face,  Genitals (private parts) with your normal soap.             6.  Wash thoroughly, paying special attention to the area where your surgery  will be performed.  7.  Thoroughly rinse your body with warm water from the neck down.  8.  DO NOT shower/wash with your normal soap after using and rinsing off  the CHG Soap.                9.  Pat yourself dry with a clean towel.            10.  Wear clean pajamas.            11.  Place clean sheets on your bed the night of your first shower and do not  sleep with pets. Day of Surgery : Do not apply any lotions/deodorants the morning of surgery.  Please wear clean clothes to the hospital/surgery center.  FAILURE TO FOLLOW THESE INSTRUCTIONS MAY RESULT IN THE CANCELLATION OF YOUR SURGERY PATIENT SIGNATURE_________________________________  NURSE SIGNATURE__________________________________  ________________________________________________________________________

## 2017-10-26 ENCOUNTER — Encounter (HOSPITAL_COMMUNITY)
Admission: RE | Admit: 2017-10-26 | Discharge: 2017-10-26 | Disposition: A | Discharge status: Home / Self Care | Source: Ambulatory Visit | Attending: General Surgery | Admitting: General Surgery

## 2017-10-26 ENCOUNTER — Other Ambulatory Visit: Payer: Self-pay

## 2017-10-26 ENCOUNTER — Encounter (HOSPITAL_COMMUNITY): Payer: Self-pay

## 2017-10-26 DIAGNOSIS — Z79899 Other long term (current) drug therapy: Secondary | ICD-10-CM | POA: Diagnosis not present

## 2017-10-26 DIAGNOSIS — K8010 Calculus of gallbladder with chronic cholecystitis without obstruction: Secondary | ICD-10-CM | POA: Diagnosis not present

## 2017-10-26 DIAGNOSIS — E079 Disorder of thyroid, unspecified: Secondary | ICD-10-CM | POA: Diagnosis not present

## 2017-10-26 HISTORY — DX: Other specified postprocedural states: R11.2

## 2017-10-26 HISTORY — DX: Other specified postprocedural states: Z98.890

## 2017-10-26 LAB — CBC WITH DIFFERENTIAL/PLATELET
BASOS PCT: 1 %
Basophils Absolute: 0.1 10*3/uL (ref 0.0–0.1)
EOS ABS: 0.2 10*3/uL (ref 0.0–0.7)
EOS PCT: 3 %
HCT: 41.4 % (ref 36.0–46.0)
HEMOGLOBIN: 13.8 g/dL (ref 12.0–15.0)
Lymphocytes Relative: 18 %
Lymphs Abs: 1.7 10*3/uL (ref 0.7–4.0)
MCH: 28.1 pg (ref 26.0–34.0)
MCHC: 33.3 g/dL (ref 30.0–36.0)
MCV: 84.3 fL (ref 78.0–100.0)
Monocytes Absolute: 0.4 10*3/uL (ref 0.1–1.0)
Monocytes Relative: 4 %
NEUTROS PCT: 74 %
Neutro Abs: 7.3 10*3/uL (ref 1.7–7.7)
Platelets: 330 10*3/uL (ref 150–400)
RBC: 4.91 MIL/uL (ref 3.87–5.11)
RDW: 14.6 % (ref 11.5–15.5)
WBC: 9.7 10*3/uL (ref 4.0–10.5)

## 2017-10-26 LAB — COMPREHENSIVE METABOLIC PANEL
ALBUMIN: 4.1 g/dL (ref 3.5–5.0)
ALK PHOS: 116 U/L (ref 38–126)
ALT: 29 U/L (ref 14–54)
ANION GAP: 8 (ref 5–15)
AST: 21 U/L (ref 15–41)
BILIRUBIN TOTAL: 1.1 mg/dL (ref 0.3–1.2)
BUN: 11 mg/dL (ref 6–20)
CALCIUM: 10 mg/dL (ref 8.9–10.3)
CO2: 25 mmol/L (ref 22–32)
Chloride: 106 mmol/L (ref 101–111)
Creatinine, Ser: 0.74 mg/dL (ref 0.44–1.00)
GFR calc non Af Amer: >60 mL/min (ref >60)
Glucose, Bld: 126 mg/dL — ABNORMAL HIGH (ref 65–99)
POTASSIUM: 4.4 mmol/L (ref 3.5–5.1)
SODIUM: 139 mmol/L (ref 135–145)
TOTAL PROTEIN: 8.6 g/dL — AB (ref 6.5–8.1)

## 2017-10-26 LAB — HCG, SERUM, QUALITATIVE: Preg, Serum: NEGATIVE

## 2017-10-28 ENCOUNTER — Ambulatory Visit (HOSPITAL_COMMUNITY)
Admission: RE | Admit: 2017-10-28 | Discharge: 2017-10-28 | Disposition: A | Discharge status: Home / Self Care | Source: Ambulatory Visit | Attending: General Surgery | Admitting: General Surgery

## 2017-10-28 ENCOUNTER — Other Ambulatory Visit: Payer: Self-pay

## 2017-10-28 ENCOUNTER — Ambulatory Visit (HOSPITAL_COMMUNITY): Admitting: Anesthesiology

## 2017-10-28 ENCOUNTER — Encounter (HOSPITAL_COMMUNITY)
Admission: RE | Disposition: A | Discharge status: Home / Self Care | Payer: Self-pay | Source: Ambulatory Visit | Attending: General Surgery

## 2017-10-28 ENCOUNTER — Encounter (HOSPITAL_COMMUNITY): Payer: Self-pay | Admitting: *Deleted

## 2017-10-28 DIAGNOSIS — Z79899 Other long term (current) drug therapy: Secondary | ICD-10-CM | POA: Insufficient documentation

## 2017-10-28 DIAGNOSIS — K8010 Calculus of gallbladder with chronic cholecystitis without obstruction: Secondary | ICD-10-CM | POA: Diagnosis not present

## 2017-10-28 DIAGNOSIS — E079 Disorder of thyroid, unspecified: Secondary | ICD-10-CM | POA: Insufficient documentation

## 2017-10-28 HISTORY — PX: CHOLECYSTECTOMY: SHX55

## 2017-10-28 SURGERY — LAPAROSCOPIC CHOLECYSTECTOMY
Anesthesia: General | Site: Abdomen

## 2017-10-28 MED ORDER — ONDANSETRON HCL 4 MG/2ML IJ SOLN
INTRAMUSCULAR | Status: AC
  Filled 2017-10-28: qty 2

## 2017-10-28 MED ORDER — HYDROMORPHONE HCL 1 MG/ML IJ SOLN
INTRAMUSCULAR | Status: AC
  Filled 2017-10-28: qty 1

## 2017-10-28 MED ORDER — DEXAMETHASONE SODIUM PHOSPHATE 10 MG/ML IJ SOLN
INTRAMUSCULAR | Status: AC
  Filled 2017-10-28: qty 1

## 2017-10-28 MED ORDER — MIDAZOLAM HCL 2 MG/2ML IJ SOLN
INTRAMUSCULAR | Status: AC
  Filled 2017-10-28: qty 2

## 2017-10-28 MED ORDER — 0.9 % SODIUM CHLORIDE (POUR BTL) OPTIME
TOPICAL | Status: DC | PRN
  Administered 2017-10-28: 1000 mL

## 2017-10-28 MED ORDER — ONDANSETRON HCL 4 MG/2ML IJ SOLN
INTRAMUSCULAR | Status: DC | PRN
  Administered 2017-10-28: 4 mg via INTRAVENOUS

## 2017-10-28 MED ORDER — CHLORHEXIDINE GLUCONATE CLOTH 2 % EX PADS: 6.0000 | MEDICATED_PAD | Freq: Once | CUTANEOUS | Status: DC

## 2017-10-28 MED ORDER — SUGAMMADEX SODIUM 500 MG/5ML IV SOLN
INTRAVENOUS | Status: DC | PRN
  Administered 2017-10-28: 380 mg via INTRAVENOUS

## 2017-10-28 MED ORDER — SCOPOLAMINE 1 MG/3DAYS TD PT72
1.0000 | MEDICATED_PATCH | TRANSDERMAL | Status: DC
  Administered 2017-10-28: 1.5 mg via TRANSDERMAL
  Filled 2017-10-28: qty 1

## 2017-10-28 MED ORDER — BUPIVACAINE-EPINEPHRINE (PF) 0.25% -1:200000 IJ SOLN
INTRAMUSCULAR | Status: AC
  Filled 2017-10-28: qty 30

## 2017-10-28 MED ORDER — ACETAMINOPHEN 500 MG PO TABS
1000.0000 mg | ORAL_TABLET | ORAL | Status: AC
  Administered 2017-10-28: 1000 mg via ORAL
  Filled 2017-10-28: qty 2

## 2017-10-28 MED ORDER — CEFOTETAN DISODIUM-DEXTROSE 2-2.08 GM-%(50ML) IV SOLR
2.0000 g | INTRAVENOUS | Status: AC
  Administered 2017-10-28: 2 g via INTRAVENOUS
  Filled 2017-10-28: qty 50

## 2017-10-28 MED ORDER — LACTATED RINGERS IV SOLN
INTRAVENOUS | Status: DC
  Administered 2017-10-28 (×2): via INTRAVENOUS

## 2017-10-28 MED ORDER — FENTANYL CITRATE (PF) 250 MCG/5ML IJ SOLN
INTRAMUSCULAR | Status: AC
  Filled 2017-10-28: qty 5

## 2017-10-28 MED ORDER — PROPOFOL 10 MG/ML IV BOLUS
INTRAVENOUS | Status: AC
  Filled 2017-10-28: qty 20

## 2017-10-28 MED ORDER — ONDANSETRON HCL 4 MG/2ML IJ SOLN
4.0000 mg | Freq: Once | INTRAMUSCULAR | Status: AC
  Administered 2017-10-28: 4 mg via INTRAVENOUS
  Filled 2017-10-28: qty 2

## 2017-10-28 MED ORDER — HYDROMORPHONE HCL 1 MG/ML IJ SOLN
0.2500 mg | INTRAMUSCULAR | Status: DC | PRN
  Administered 2017-10-28 (×4): 0.5 mg via INTRAVENOUS

## 2017-10-28 MED ORDER — FENTANYL CITRATE (PF) 100 MCG/2ML IJ SOLN
INTRAMUSCULAR | Status: DC | PRN
  Administered 2017-10-28: 50 ug via INTRAVENOUS

## 2017-10-28 MED ORDER — LACTATED RINGERS IR SOLN
Status: DC | PRN
  Administered 2017-10-28: 1000 mL

## 2017-10-28 MED ORDER — SUGAMMADEX SODIUM 500 MG/5ML IV SOLN
INTRAVENOUS | Status: AC
  Filled 2017-10-28: qty 5

## 2017-10-28 MED ORDER — GABAPENTIN 300 MG PO CAPS
300.0000 mg | ORAL_CAPSULE | ORAL | Status: AC
  Administered 2017-10-28: 300 mg via ORAL
  Filled 2017-10-28: qty 1

## 2017-10-28 MED ORDER — PROPOFOL 10 MG/ML IV BOLUS
INTRAVENOUS | Status: DC | PRN
  Administered 2017-10-28: 190 mg via INTRAVENOUS

## 2017-10-28 MED ORDER — ROCURONIUM BROMIDE 50 MG/5ML IV SOSY
PREFILLED_SYRINGE | INTRAVENOUS | Status: AC
  Filled 2017-10-28: qty 5

## 2017-10-28 MED ORDER — BUPIVACAINE-EPINEPHRINE 0.25% -1:200000 IJ SOLN
INTRAMUSCULAR | Status: DC | PRN
  Administered 2017-10-28: 30 mL

## 2017-10-28 MED ORDER — LIDOCAINE HCL (CARDIAC) 20 MG/ML IV SOLN
INTRAVENOUS | Status: DC | PRN
  Administered 2017-10-28: 50 mg via INTRAVENOUS
  Administered 2017-10-28: 25 mg via INTRAVENOUS

## 2017-10-28 MED ORDER — LIDOCAINE 2% (20 MG/ML) 5 ML SYRINGE
INTRAMUSCULAR | Status: AC
  Filled 2017-10-28: qty 5

## 2017-10-28 MED ORDER — DEXAMETHASONE SODIUM PHOSPHATE 10 MG/ML IJ SOLN
INTRAMUSCULAR | Status: DC | PRN
  Administered 2017-10-28: 10 mg via INTRAVENOUS

## 2017-10-28 MED ORDER — MIDAZOLAM HCL 5 MG/5ML IJ SOLN
INTRAMUSCULAR | Status: DC | PRN
  Administered 2017-10-28 (×2): 1 mg via INTRAVENOUS

## 2017-10-28 MED ORDER — ROCURONIUM BROMIDE 100 MG/10ML IV SOLN
INTRAVENOUS | Status: DC | PRN
  Administered 2017-10-28: 50 mg via INTRAVENOUS

## 2017-10-28 MED ORDER — HYDROCODONE-ACETAMINOPHEN 5-325 MG PO TABS: 1.0000 | ORAL_TABLET | Freq: Four times a day (QID) | ORAL | 0 refills | Status: DC | PRN

## 2017-10-28 MED ORDER — EPHEDRINE SULFATE 50 MG/ML IJ SOLN
INTRAMUSCULAR | Status: DC | PRN
  Administered 2017-10-28: 7 mg via INTRAVENOUS

## 2017-10-28 MED ORDER — ARTIFICIAL TEARS OPHTHALMIC OINT
TOPICAL_OINTMENT | OPHTHALMIC | Status: AC
  Filled 2017-10-28: qty 3.5

## 2017-10-28 MED ORDER — CELECOXIB 200 MG PO CAPS
200.0000 mg | ORAL_CAPSULE | ORAL | Status: AC
  Administered 2017-10-28: 200 mg via ORAL
  Filled 2017-10-28: qty 1

## 2017-10-28 MED ORDER — EPHEDRINE 5 MG/ML INJ
INTRAVENOUS | Status: AC
  Filled 2017-10-28: qty 10

## 2017-10-28 MED ORDER — IBUPROFEN 800 MG PO TABS: 800.0000 mg | ORAL_TABLET | Freq: Three times a day (TID) | ORAL | 0 refills | Status: DC | PRN

## 2017-10-28 SURGICAL SUPPLY — 44 items
APL SKNCLS STERI-STRIP NONHPOA (GAUZE/BANDAGES/DRESSINGS) ×1
APPLIER CLIP ROT 10 11.4 M/L (STAPLE)
APR CLP MED LRG 11.4X10 (STAPLE)
BAG SPEC RTRVL 10 TROC 200 (ENDOMECHANICALS) ×1
BANDAGE ADH SHEER 1  50/CT (GAUZE/BANDAGES/DRESSINGS) ×15 IMPLANT
BENZOIN TINCTURE PRP APPL 2/3 (GAUZE/BANDAGES/DRESSINGS) ×3 IMPLANT
CABLE HIGH FREQUENCY MONO STRZ (ELECTRODE) ×3 IMPLANT
CATH CHOLANG 76X19 KUMAR (CATHETERS) IMPLANT
CHLORAPREP W/TINT 26ML (MISCELLANEOUS) ×3 IMPLANT
CLIP APPLIE ROT 10 11.4 M/L (STAPLE) IMPLANT
CLIP VESOLOCK LG 6/CT PURPLE (CLIP) ×2 IMPLANT
CLIP VESOLOCK XL 6/CT (CLIP) IMPLANT
CLOSURE WOUND 1/2 X4 (GAUZE/BANDAGES/DRESSINGS) ×1
COVER MAYO STAND STRL (DRAPES) IMPLANT
COVER SURGICAL LIGHT HANDLE (MISCELLANEOUS) ×3 IMPLANT
DECANTER SPIKE VIAL GLASS SM (MISCELLANEOUS) ×3 IMPLANT
DRAIN CHANNEL 19F RND (DRAIN) IMPLANT
DRAPE C-ARM 42X120 X-RAY (DRAPES) IMPLANT
EVACUATOR SILICONE 100CC (DRAIN) IMPLANT
GLOVE BIOGEL PI IND STRL 7.0 (GLOVE) ×1 IMPLANT
GLOVE BIOGEL PI INDICATOR 7.0 (GLOVE) ×2
GLOVE SURG SS PI 7.0 STRL IVOR (GLOVE) ×3 IMPLANT
GOWN STRL REUS W/TWL LRG LVL3 (GOWN DISPOSABLE) ×3 IMPLANT
GOWN STRL REUS W/TWL XL LVL3 (GOWN DISPOSABLE) ×6 IMPLANT
GRASPER SUT TROCAR 14GX15 (MISCELLANEOUS) ×3 IMPLANT
IRRIG SUCT STRYKERFLOW 2 WTIP (MISCELLANEOUS) ×3
IRRIGATION SUCT STRKRFLW 2 WTP (MISCELLANEOUS) ×1 IMPLANT
KIT BASIN OR (CUSTOM PROCEDURE TRAY) ×3 IMPLANT
POUCH RETRIEVAL ECOSAC 10 (ENDOMECHANICALS) ×1 IMPLANT
POUCH RETRIEVAL ECOSAC 10MM (ENDOMECHANICALS) ×2
SCISSORS LAP 5X35 DISP (ENDOMECHANICALS) ×3 IMPLANT
SHEARS HARMONIC ACE PLUS 36CM (ENDOMECHANICALS) IMPLANT
SLEEVE XCEL OPT CAN 5 100 (ENDOMECHANICALS) ×6 IMPLANT
STOPCOCK 4 WAY LG BORE MALE ST (IV SETS) ×3 IMPLANT
STRIP CLOSURE SKIN 1/2X4 (GAUZE/BANDAGES/DRESSINGS) ×2 IMPLANT
SUT ETHILON 2 0 PS N (SUTURE) IMPLANT
SUT MNCRL AB 4-0 PS2 18 (SUTURE) ×3 IMPLANT
SUT VICRYL 0 ENDOLOOP (SUTURE) IMPLANT
TOWEL OR 17X26 10 PK STRL BLUE (TOWEL DISPOSABLE) ×3 IMPLANT
TOWEL OR NON WOVEN STRL DISP B (DISPOSABLE) ×3 IMPLANT
TRAY LAPAROSCOPIC (CUSTOM PROCEDURE TRAY) ×3 IMPLANT
TROCAR BLADELESS OPT 5 100 (ENDOMECHANICALS) ×3 IMPLANT
TROCAR XCEL 12X100 BLDLESS (ENDOMECHANICALS) ×3 IMPLANT
TUBING INSUF HEATED (TUBING) ×3 IMPLANT

## 2017-10-28 NOTE — Discharge Instructions (Signed)
General Anesthesia, Adult, Care After °These instructions provide you with information about caring for yourself after your procedure. Your health care provider may also give you more specific instructions. Your treatment has been planned according to current medical practices, but problems sometimes occur. Call your health care provider if you have any problems or questions after your procedure. °What can I expect after the procedure? °After the procedure, it is common to have: °· Vomiting. °· A sore throat. °· Mental slowness. ° °It is common to feel: °· Nauseous. °· Cold or shivery. °· Sleepy. °· Tired. °· Sore or achy, even in parts of your body where you did not have surgery. ° °Follow these instructions at home: °For at least 24 hours after the procedure: °· Do not: °? Participate in activities where you could fall or become injured. °? Drive. °? Use heavy machinery. °? Drink alcohol. °? Take sleeping pills or medicines that cause drowsiness. °? Make important decisions or sign legal documents. °? Take care of children on your own. °· Rest. °Eating and drinking °· If you vomit, drink water, juice, or soup when you can drink without vomiting. °· Drink enough fluid to keep your urine clear or pale yellow. °· Make sure you have little or no nausea before eating solid foods. °· Follow the diet recommended by your health care provider. °General instructions °· Have a responsible adult stay with you until you are awake and alert. °· Return to your normal activities as told by your health care provider. Ask your health care provider what activities are safe for you. °· Take over-the-counter and prescription medicines only as told by your health care provider. °· If you smoke, do not smoke without supervision. °· Keep all follow-up visits as told by your health care provider. This is important. °Contact a health care provider if: °· You continue to have nausea or vomiting at home, and medicines are not helpful. °· You  cannot drink fluids or start eating again. °· You cannot urinate after 8-12 hours. °· You develop a skin rash. °· You have fever. °· You have increasing redness at the site of your procedure. °Get help right away if: °· You have difficulty breathing. °· You have chest pain. °· You have unexpected bleeding. °· You feel that you are having a life-threatening or urgent problem. °This information is not intended to replace advice given to you by your health care provider. Make sure you discuss any questions you have with your health care provider. °Document Released: 02/01/2001 Document Revised: 03/30/2016 Document Reviewed: 10/10/2015 °Elsevier Interactive Patient Education © 2018 [employer]. ° °

## 2017-10-28 NOTE — Transfer of Care (Signed)
Immediate Anesthesia Transfer of Care Note  Patient: Alexis Patel  Procedure(s) Performed: LAPAROSCOPIC CHOLECYSTECTOMY (N/A Abdomen)  Patient Location: PACU  Anesthesia Type:General  Level of Consciousness: awake, alert  and oriented  Airway & Oxygen Therapy: Patient Spontanous Breathing and Patient connected to face mask oxygen  Post-op Assessment: Report given to RN and Post -op Vital signs reviewed and stable  Post vital signs: Reviewed and stable  Last Vitals:  Vitals:   10/28/17 0911  BP: (!) 152/81  Pulse: 81  Resp: 18  Temp: 36.8 C  SpO2: 100%    Last Pain:  Vitals:   10/28/17 0911  TempSrc: Oral      Patients Stated Pain Goal: 3 (10/28/17 0930)  Complications: No apparent anesthesia complications

## 2017-10-28 NOTE — H&P (Signed)
Real Alexis Patel is an 51 y.o. female.   Chief Complaint: abdominal pain HPI: 51 yo female with abdominal pain. On work up she was found to have stones and symptoms consistent with biliary colic.  Past Medical History:  Diagnosis Date  . Blood in stool   . Hemorrhoids   . PONV (postoperative nausea and vomiting)   . Thyroid disease     Past Surgical History:  Procedure Laterality Date  . orthodontic     teeth removed to make room for braces ; procedure done in tten years     Family History  Problem Relation Age of Onset  . Breast cancer Mother   . Arthritis Father   . Hyperlipidemia Father   . Stroke Father   . Hypertension Father   . Arthritis Sister   . Hyperlipidemia Sister   . Diabetes Sister    Social History:  reports that  has never smoked. she has never used smokeless tobacco. She reports that she does not drink alcohol or use drugs.  Allergies:  Allergies  Allergen Reactions  . Codeine Nausea And Vomiting    Upset stomach    Medications Prior to Admission  Medication Sig Dispense Refill  . ibuprofen (ADVIL,MOTRIN) 200 MG tablet Take 800 mg by mouth 2 (two) times daily as needed for moderate pain.     . Magnesium 250 MG TABS Take 250 mg by mouth daily.    . ondansetron (ZOFRAN ODT) 4 MG disintegrating tablet Take 1 tablet (4 mg total) every 8 (eight) hours as needed by mouth for nausea or vomiting. 20 tablet 0  . OVER THE COUNTER MEDICATION Take 2 capsules by mouth 2 (two) times daily. Plexus Biocleanse otc supplement    . Simethicone 180 MG CAPS Take 180 mg by mouth 2 (two) times daily as needed (gas).    . Homeopathic Products (ZICAM ALLERGY RELIEF NA) Place into the nose as needed (" i started it two days ago ; and it said you could do it every three hours but i judt do it 3 times a day ").    . mometasone (NASONEX) 50 MCG/ACT nasal spray Place 1 spray into the nose 2 (two) times daily as needed (allergies).    Marland Kitchen. oxyCODONE-acetaminophen (PERCOCET/ROXICET) 5-325  MG tablet Take 1 tablet every 6 (six) hours as needed by mouth for severe pain. (Patient not taking: Reported on 10/22/2017) 11 tablet 0  . Pseudoephedrine-APAP-DM (DAYQUIL PO) Take by mouth as needed (i took two capsules yesterday ; and took one today).      No results found for this or any previous visit (from the past 48 hour(s)). No results found.  Review of Systems  Constitutional: Negative for chills and fever.  HENT: Negative for hearing loss.   Eyes: Negative for blurred vision and double vision.  Respiratory: Negative for cough and hemoptysis.   Cardiovascular: Negative for chest pain and palpitations.  Gastrointestinal: Positive for abdominal pain and nausea. Negative for vomiting.  Genitourinary: Negative for dysuria and urgency.  Musculoskeletal: Negative for myalgias and neck pain.  Skin: Negative for itching and rash.  Neurological: Negative for dizziness, tingling and headaches.  Endo/Heme/Allergies: Does not bruise/bleed easily.  Psychiatric/Behavioral: Negative for depression and suicidal ideas.    Blood pressure (!) 152/81, pulse 81, temperature 98.2 F (36.8 C), temperature source Oral, resp. rate 18, height 5\' 3"  (1.6 m), weight 95.7 kg (211 lb), SpO2 100 %. Physical Exam  Vitals reviewed. Constitutional: She is oriented to person, place, and time.  She appears well-developed and well-nourished.  HENT:  Head: Normocephalic and atraumatic.  Eyes: Conjunctivae and EOM are normal. Pupils are equal, round, and reactive to light.  Neck: Normal range of motion. Neck supple.  Cardiovascular: Normal rate and regular rhythm.  Respiratory: Effort normal and breath sounds normal.  GI: Soft. Bowel sounds are normal. She exhibits no distension. There is no tenderness.  Musculoskeletal: Normal range of motion.  Neurological: She is alert and oriented to person, place, and time.  Skin: Skin is warm and dry.  Psychiatric: She has a normal mood and affect. Her behavior is  normal.     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

## 2017-10-28 NOTE — Anesthesia Procedure Notes (Signed)
Procedure Name: Intubation Date/Time: 10/28/2017 10:55 AM Performed by: Garrel Ridgel, CRNA Pre-anesthesia Checklist: Patient identified, Emergency Drugs available, Suction available, Patient being monitored and Timeout performed Patient Re-evaluated:Patient Re-evaluated prior to induction Oxygen Delivery Method: Circle system utilized Preoxygenation: Pre-oxygenation with 100% oxygen Induction Type: IV induction Ventilation: Mask ventilation without difficulty Laryngoscope Size: Mac and 3 Grade View: Grade II Tube type: Oral Tube size: 7.0 mm Number of attempts: 1 Airway Equipment and Method: Stylet Placement Confirmation: ETT inserted through vocal cords under direct vision,  CO2 detector,  positive ETCO2 and breath sounds checked- equal and bilateral Secured at: 21 cm Tube secured with: Tape Dental Injury: Teeth and Oropharynx as per pre-operative assessment

## 2017-10-28 NOTE — Op Note (Signed)
PATIENT:  Alexis Patel  51 y.o. female  PRE-OPERATIVE DIAGNOSIS:  chronic calculous cholecystitis  POST-OPERATIVE DIAGNOSIS:  chronic calculous cholecystitis  PROCEDURE:  Procedure(s): LAPAROSCOPIC CHOLECYSTECTOMY   SURGEON:  Surgeon(s): Kinsinger, De BlanchLuke Aaron, MD  ASSISTANT: none  ANESTHESIA:   local and general  Indications for procedure: Alexis Consmelia Padron is a 51 y.o. female with symptoms of Abdominal pain consistent with gallbladder disease, Confirmed by Ultrasound.  Description of procedure: The patient was brought into the operative suite, placed supine. Anesthesia was administered with endotracheal tube. Patient was strapped in place and foot board was secured. All pressure points were offloaded by foam padding. The patient was prepped and draped in the usual sterile fashion.  A small incision was made to the right of the umbilicus. A 5mm trocar was inserted into the peritoneal cavity with optical entry. Pneumoperitoneum was applied with high flow low pressure. 2 5mm trocars were placed in the RUQ. A 12mm trocar was placed in the subxiphoid space. All trocars sites were first anesthesized with marcaine with epinephrine in the subcutaneous and preperitoneal layers. Next the patient was placed in reverse trendelenberg.   The gallbladder was retracted cephalad and lateral. The peritoneum was reflected off the infundibulum working lateral to medial. The cystic duct and cystic artery were identified and further dissection revealed a critical view. The cystic duct and cystic artery were doubly clipped and ligated.   The gallbladder was removed off the liver bed with cautery. The Gallbladder was placed in a specimen bag. The gallbladder fossa was irrigated and hemostasis was applied with cautery. The gallbladder was removed via the 12mm trocar. No dilation was required for removal, therefore no fascial closure was performed. Pneumoperitoneum was removed, all trocar were removed. All incisions were  closed with 4-0 monocryl subcuticular stitch. The patient woke from anesthesia and was brought to PACU in stable condition. All counts were correct  Findings: normal critical view  Specimen: gallbladder  Blood loss: <30 ml  Local anesthesia: 30 ml marcaine  Complications: none  PLAN OF CARE: Discharge to home after PACU  PATIENT DISPOSITION:  PACU - hemodynamically stable.  Feliciana RossettiLuke Kinsinger, M.D. General, Bariatric, & Minimally Invasive Surgery [facility]

## 2017-10-28 NOTE — Anesthesia Preprocedure Evaluation (Signed)
Anesthesia Evaluation  Patient identified by MRN, date of birth, ID band Patient awake    Reviewed: Allergy & Precautions, H&P , Patient's Chart, lab work & pertinent test results, reviewed documented beta blocker date and time   Airway Mallampati: II  TM Distance: >3 FB Neck ROM: full    Dental no notable dental hx.    Pulmonary    Pulmonary exam normal breath sounds clear to auscultation       Cardiovascular  Rhythm:regular Rate:Normal     Neuro/Psych    GI/Hepatic   Endo/Other    Renal/GU      Musculoskeletal   Abdominal   Peds  Hematology   Anesthesia Other Findings   Reproductive/Obstetrics                             Anesthesia Physical Anesthesia Plan  ASA: II  Anesthesia Plan: General   Post-op Pain Management:    Induction: Intravenous  PONV Risk Score and Plan:   Airway Management Planned: Oral ETT  Additional Equipment:   Intra-op Plan:   Post-operative Plan: Extubation in OR  Informed Consent: I have reviewed the patients History and Physical, chart, labs and discussed the procedure including the risks, benefits and alternatives for the proposed anesthesia with the patient or authorized representative who has indicated his/her understanding and acceptance.   Dental Advisory Given  Plan Discussed with: CRNA and Surgeon  Anesthesia Plan Comments: (  )        Anesthesia Quick Evaluation  

## 2017-10-29 ENCOUNTER — Encounter (HOSPITAL_COMMUNITY): Payer: Self-pay | Admitting: General Surgery

## 2017-10-29 NOTE — Anesthesia Postprocedure Evaluation (Signed)
Anesthesia Post Note  Patient: Alexis Patel  Procedure(s) Performed: LAPAROSCOPIC CHOLECYSTECTOMY (N/A Abdomen)     Patient location during evaluation: PACU Anesthesia Type: General Level of consciousness: awake and alert Pain management: pain level controlled Vital Signs Assessment: post-procedure vital signs reviewed and stable Respiratory status: spontaneous breathing, nonlabored ventilation, respiratory function stable and patient connected to nasal cannula oxygen Cardiovascular status: blood pressure returned to baseline and stable Postop Assessment: no apparent nausea or vomiting Anesthetic complications: no    Last Vitals:  Vitals:   10/28/17 1328 10/28/17 1451  BP: 137/66 124/75  Pulse: 75 85  Resp: 12   Temp: (!) 36.4 C 36.4 C  SpO2: 98% 99%    Last Pain:  Vitals:   10/28/17 1451  TempSrc: Oral  PainSc: 1                  Alexis Patel

## 2018-04-18 ENCOUNTER — Other Ambulatory Visit: Payer: Self-pay | Admitting: Primary Care

## 2018-04-18 DIAGNOSIS — Z1231 Encounter for screening mammogram for malignant neoplasm of breast: Secondary | ICD-10-CM

## 2018-04-28 ENCOUNTER — Ambulatory Visit
Admission: RE | Admit: 2018-04-28 | Discharge: 2018-04-28 | Disposition: A | Discharge status: Home / Self Care | Source: Ambulatory Visit | Attending: Primary Care | Admitting: Primary Care

## 2018-04-28 ENCOUNTER — Other Ambulatory Visit: Payer: Self-pay | Admitting: Primary Care

## 2018-04-28 DIAGNOSIS — R928 Other abnormal and inconclusive findings on diagnostic imaging of breast: Secondary | ICD-10-CM

## 2018-04-28 DIAGNOSIS — Z1231 Encounter for screening mammogram for malignant neoplasm of breast: Secondary | ICD-10-CM

## 2018-05-03 ENCOUNTER — Ambulatory Visit
Admission: RE | Admit: 2018-05-03 | Discharge: 2018-05-03 | Disposition: A | Discharge status: Home / Self Care | Source: Ambulatory Visit | Attending: Primary Care | Admitting: Primary Care

## 2018-05-03 DIAGNOSIS — R928 Other abnormal and inconclusive findings on diagnostic imaging of breast: Secondary | ICD-10-CM

## 2019-01-26 ENCOUNTER — Other Ambulatory Visit: Payer: Self-pay

## 2019-01-26 ENCOUNTER — Telehealth: Payer: Self-pay

## 2019-01-26 ENCOUNTER — Encounter: Payer: Self-pay | Admitting: Family Medicine

## 2019-01-26 ENCOUNTER — Ambulatory Visit (INDEPENDENT_AMBULATORY_CARE_PROVIDER_SITE_OTHER): Admitting: Family Medicine

## 2019-01-26 VITALS — BP 166/100 | HR 79 | Temp 99.1°F | Resp 16 | Ht 63.0 in | Wt 232.0 lb

## 2019-01-26 DIAGNOSIS — J069 Acute upper respiratory infection, unspecified: Secondary | ICD-10-CM | POA: Diagnosis not present

## 2019-01-26 DIAGNOSIS — I10 Essential (primary) hypertension: Secondary | ICD-10-CM | POA: Diagnosis not present

## 2019-01-26 DIAGNOSIS — B9789 Other viral agents as the cause of diseases classified elsewhere: Secondary | ICD-10-CM

## 2019-01-26 MED ORDER — AMLODIPINE BESYLATE 5 MG PO TABS: 5.0000 mg | ORAL_TABLET | Freq: Every day | ORAL | 0 refills | Status: DC

## 2019-01-26 NOTE — Progress Notes (Signed)
Subjective:     Alexis Patel is a 53 y.o. female presenting for Cough (symptoms started 01/21/2019. Started with sore throat. Now has a dry cough. No fever. No body aches. Has had some nasal congestion.)     URI   This is a new problem. The current episode started in the past 7 days. The problem has been gradually worsening. There has been no fever. Associated symptoms include congestion, coughing, a plugged ear sensation and a sore throat. Pertinent negatives include no diarrhea, ear pain, nausea, rhinorrhea, sinus pain, sneezing or vomiting. Treatments tried: flonase, vit c. The treatment provided no relief.   #HTN - has had high blood pressure in the past - not on medication    Review of Systems  HENT: Positive for congestion and sore throat. Negative for ear pain, rhinorrhea, sinus pressure, sinus pain and sneezing.   Eyes: Positive for redness and itching.  Respiratory: Positive for cough and shortness of breath (mild).   Gastrointestinal: Negative for diarrhea, nausea and vomiting.     Social History   Tobacco Use  Smoking Status Never Smoker  Smokeless Tobacco Never Used        Objective:    BP Readings from Last 3 Encounters:  01/26/19 (!) 166/100  10/28/17 124/75  10/26/17 (!) 147/74   Wt Readings from Last 3 Encounters:  01/26/19 232 lb (105.2 kg)  10/28/17 211 lb (95.7 kg)  10/26/17 211 lb (95.7 kg)    BP (!) 166/100   Pulse 79   Temp 99.1 F (37.3 C)   Resp 16   Ht 5\' 3"  (1.6 m)   Wt 232 lb (105.2 kg)   LMP 11/28/2018   SpO2 97%   BMI 41.10 kg/m    Physical Exam Constitutional:      General: She is not in acute distress.    Appearance: She is well-developed. She is not diaphoretic.  HENT:     Head: Normocephalic and atraumatic.     Right Ear: Tympanic membrane and ear canal normal.     Left Ear: Tympanic membrane and ear canal normal.     Nose: Mucosal edema and rhinorrhea present.     Right Sinus: No maxillary sinus tenderness or  frontal sinus tenderness.     Left Sinus: No maxillary sinus tenderness or frontal sinus tenderness.     Mouth/Throat:     Pharynx: Uvula midline. Posterior oropharyngeal erythema present. No oropharyngeal exudate.     Tonsils: 0 on the right. 0 on the left.  Eyes:     General: No scleral icterus.    Conjunctiva/sclera: Conjunctivae normal.  Neck:     Musculoskeletal: Neck supple.  Cardiovascular:     Rate and Rhythm: Normal rate and regular rhythm.     Heart sounds: Normal heart sounds. No murmur.  Pulmonary:     Effort: Pulmonary effort is normal. No respiratory distress.     Breath sounds: Normal breath sounds.  Lymphadenopathy:     Cervical: No cervical adenopathy.  Skin:    General: Skin is warm and dry.     Capillary Refill: Capillary refill takes less than 2 seconds.  Neurological:     Mental Status: She is alert.           Assessment & Plan:   Problem List Items Addressed This Visit      Cardiovascular and Mediastinum   Hypertension    Suspect HTN with bp elevated today and remains high on repeat. Discussed option of starting medication  now (which was recommended given >160/100) and pt agreed to start low dose. Recommended PCP f/u in 2 months for repeat and to call sooner if remaining elevated on home monitoring. DASH/Exercise advised      Relevant Medications   amLODipine (NORVASC) 5 MG tablet    Other Visit Diagnoses    Viral URI with cough    -  Primary     Symptomatic care  Return in about 2 months (around 03/28/2019) for BP check and follow-up.  Lynnda Child, MD

## 2019-01-26 NOTE — Telephone Encounter (Signed)
I spoke with pt; pt has not traveled and no exposure to positive covid or flu pt. Starting 1 1/2 wks ago started nasal congestion; on 01/22/19 S/T, non prod cough, slight SOB, no trouble breathing. Temp was normal this morning. Pt has taken zicam and vitamins. Pt has a son with down syndrome and wants appt to be evaluated. Pt scheduled appt 01/26/19 at 2pm.

## 2019-01-26 NOTE — Telephone Encounter (Signed)
Pt left message on triage line stating she is having some symptoms of possible Covid 19. Wants to discuss with nurse if she needs an OV.

## 2019-01-26 NOTE — Patient Instructions (Addendum)
Based on your symptoms, it looks like you have a virus.   Antibiotics are not need for a viral infection but the following will help:   1. Drink plenty of fluids 2. Get lots of rest  Sinus Congestion 1) Neti Pot (Saline rinse) -- 2 times day -- if tolerated 2) Flonase (Store Brand ok) - once daily 3) 24 hour allergy medication - store brand is fine  Cough 1) Cough drops can be helpful 2) Nyquil (or nighttime cough medication) 3) Honey is proven to be one of the best cough medications   Sore Throat 1) Honey as above, cough drops 2) Ibuprofen or Aleve can be helpful 3) Salt water Gargles  If you develop fevers (Temperature >100.4), chills, worsening symptoms or symptoms lasting longer than 10 days return to clinic.    Your blood pressure high.   High blood pressure increases your risk for heart attack and stroke.    Please check your blood pressure 2-4 times a week.   To check your blood pressure 1) Sit in a quiet and relaxed place for 5 minutes 2) Make sure your feet are flat on the ground 3) Consider checking first thing in the morning   Normal blood pressure is less than 140/90 Ideally you blood pressure should be around 120/80  Other ways you can reduce your blood pressure:  1) Regular exercise -- Try to get 150 minutes (30 minutes, 5 days a week) of moderate to vigorous aerobic excercise -- Examples: brisk walking (2.5 miles per hour), water aerobics, dancing, gardening, tennis, biking slower than 10 miles per hour 2) DASH Diet - low fat meats, more fresh fruits and vegetables, whole grains, low salt 3) Quit smoking if you smoke 4) Loose 5-10% of your body weight

## 2019-01-26 NOTE — Assessment & Plan Note (Addendum)
Suspect HTN with bp elevated today and remains high on repeat. Discussed option of starting medication now (which was recommended given >160/100) and pt agreed to start low dose. Recommended PCP f/u in 2 months for repeat and to call sooner if remaining elevated on home monitoring. DASH/Exercise advised

## 2019-04-07 ENCOUNTER — Other Ambulatory Visit: Payer: Self-pay | Admitting: Primary Care

## 2019-04-07 DIAGNOSIS — Z1231 Encounter for screening mammogram for malignant neoplasm of breast: Secondary | ICD-10-CM

## 2019-04-12 ENCOUNTER — Telehealth: Admitting: Nurse Practitioner

## 2019-04-12 DIAGNOSIS — J309 Allergic rhinitis, unspecified: Secondary | ICD-10-CM | POA: Diagnosis not present

## 2019-04-12 MED ORDER — POLYMYXIN B-TRIMETHOPRIM 10000-0.1 UNIT/ML-% OP SOLN: 2.0000 [drp] | OPHTHALMIC | 0 refills | Status: DC

## 2019-04-12 NOTE — Progress Notes (Signed)
We are sorry that you are not feeling well.  Here is how we plan to help!  Based on what you have shared with me it looks like you have  Allergic conjunctivitis.  Allergic Conjunctivitis is a common inflammatory reaction to allergens of the eye. Marland Kitchen  However, not all conjunctivitis requires antibiotics (ex. Allergic).  We have made appropriate suggestions for you based upon your presentation.  I recommend that you use OpconA, 1-2 drops every 4-6 hours (an over the counter allergy drop available at your local pharmacy).  Your pharmacist may have an alternative suggestion.  Pink eye can be highly contagious.  It is typically spread through direct contact with secretions, or contaminated objects or surfaces that one may have touched.  Strict handwashing is suggested with soap and water is urged.  If not available, use alcohol based had sanitizer.  Avoid unnecessary touching of the eye.  If you wear contact lenses, you will need to refrain from wearing them until you see no white discharge from the eye for at least 24 hours after being on medication.  You should see symptom improvement in 1-2 days after starting the medication regimen.  Call us if symptoms are not improved in 1-2 days.  Home Care:  Wash your hands often!  Do not wear your contacts until you complete your treatment plan.  Avoid sharing towels, bed linen, personal items with a person who has pink eye.  See attention for anyone in your home with similar symptoms.  Get Help Right Away If:  Your symptoms do not improve.  You develop blurred or loss of vision.  Your symptoms worsen (increased discharge, pain or redness)  Your e-visit answers were reviewed by a board certified advanced clinical practitioner to complete your personal care plan.  Depending on the condition, your plan could have included both over the counter or prescription medications.  If there is a problem please reply  once you have received a response from your  provider.  Your safety is important to [place].  If you have drug allergies check your prescription carefully.    You can use MyChart to ask questions about today's visit, request a non-urgent call back, or ask for a work or school excuse for 24 hours related to this e-Visit. If it has been greater than 24 hours you will need to follow up with your provider, or enter a new e-Visit to address those concerns.   You will get an e-mail in the next two days asking about your experience.  I hope that your e-visit has been valuable and will speed your recovery. Thank you for using e-visits.   5-10 minutes spent reviewing and documenting in chart.

## 2019-04-12 NOTE — Addendum Note (Signed)
Addended by: Bennie Pierini on: 04/12/2019 11:45 AM   Modules accepted: Orders

## 2019-04-12 NOTE — Progress Notes (Signed)
Patient sent message that she has been using allergy eye drops which was not specific in questionaire. Sent in antibiotic eye drops. Meds ordered this encounter  Medications  . trimethoprim-polymyxin b (POLYTRIM) ophthalmic solution    Sig: Place 2 drops into both eyes every 4 (four) hours.    Dispense:  10 mL    Refill:  0    Order Specific Question:   Supervising Provider    Answer:   Eber Hong [[id number]]

## 2019-05-26 ENCOUNTER — Other Ambulatory Visit: Payer: Self-pay

## 2019-05-26 ENCOUNTER — Ambulatory Visit
Admission: RE | Admit: 2019-05-26 | Discharge: 2019-05-26 | Disposition: A | Discharge status: Home / Self Care | Source: Ambulatory Visit | Attending: Primary Care | Admitting: Primary Care

## 2019-05-26 DIAGNOSIS — Z1231 Encounter for screening mammogram for malignant neoplasm of breast: Secondary | ICD-10-CM

## 2020-02-04 ENCOUNTER — Ambulatory Visit: Attending: Internal Medicine

## 2020-02-04 DIAGNOSIS — Z23 Encounter for immunization: Secondary | ICD-10-CM

## 2020-02-04 NOTE — Progress Notes (Signed)
   Covid-19 Vaccination Clinic  Name:  Alexis Patel    MRN: [id number] DOB: 02-Jun-1966  02/04/2020  Ms. Alexis Patel was observed post Covid-19 immunization for 15 minutes without incident. She was provided with Vaccine Information Sheet and instruction to access the V-Safe system.   Ms. Alexis Patel was instructed to call 911 with any severe reactions post vaccine: Marland Kitchen Difficulty breathing  . Swelling of face and throat  . A fast heartbeat  . A bad rash all over body  . Dizziness and weakness   Immunizations Administered    Name Date Dose VIS Date Route   Pfizer COVID-19 Vaccine 02/04/2020  8:42 AM 0.3 mL 10/20/2019 Intramuscular   Manufacturer: [employer], [employer]   Lot: [id number]   NDC: [address]-7

## 2020-02-27 ENCOUNTER — Ambulatory Visit: Attending: Internal Medicine

## 2020-02-27 DIAGNOSIS — Z23 Encounter for immunization: Secondary | ICD-10-CM

## 2020-02-27 NOTE — Progress Notes (Signed)
   Covid-19 Vaccination Clinic  Name:  Alexis Patel    MRN: [id number] DOB: 22-Mar-1966  02/27/2020  Alexis Patel was observed post Covid-19 immunization for 15 minutes without incident. She was provided with Vaccine Information Sheet and instruction to access the V-Safe system.   Alexis Patel was instructed to call 911 with any severe reactions post vaccine: Marland Kitchen Difficulty breathing  . Swelling of face and throat  . A fast heartbeat  . A bad rash all over body  . Dizziness and weakness   Immunizations Administered    Name Date Dose VIS Date Route   Pfizer COVID-19 Vaccine 02/27/2020 11:10 AM 0.3 mL 01/03/2019 Intramuscular   Manufacturer: [employer], [employer]   Lot: [id number]   NDC: [address]-6

## 2020-07-07 ENCOUNTER — Other Ambulatory Visit: Payer: Self-pay | Admitting: Family Medicine

## 2020-07-07 DIAGNOSIS — I10 Essential (primary) hypertension: Secondary | ICD-10-CM

## 2021-03-07 ENCOUNTER — Other Ambulatory Visit: Payer: Self-pay | Admitting: Primary Care

## 2021-03-07 ENCOUNTER — Other Ambulatory Visit: Payer: Self-pay | Admitting: Internal Medicine

## 2021-03-07 DIAGNOSIS — Z1231 Encounter for screening mammogram for malignant neoplasm of breast: Secondary | ICD-10-CM

## 2021-03-13 ENCOUNTER — Ambulatory Visit
Admission: RE | Admit: 2021-03-13 | Discharge: 2021-03-13 | Disposition: A | Discharge status: Home / Self Care | Source: Ambulatory Visit

## 2021-03-13 ENCOUNTER — Other Ambulatory Visit: Payer: Self-pay

## 2021-03-13 DIAGNOSIS — Z1231 Encounter for screening mammogram for malignant neoplasm of breast: Secondary | ICD-10-CM

## 2022-11-27 IMAGING — MG MM DIGITAL SCREENING BILAT W/ TOMO AND CAD
6 of 10 series · 6 of 30 positions shown · non-contrast
Comparison: Previous exam(s).

CLINICAL DATA: Screening.

EXAM:
DIGITAL SCREENING BILATERAL MAMMOGRAM WITH TOMOSYNTHESIS AND CAD
TECHNIQUE: Bilateral screening digital craniocaudal and mediolateral oblique
mammograms were obtained. Bilateral screening digital breast
tomosynthesis was performed. The images were evaluated with
computer-aided detection.

[R CC synth-2D]
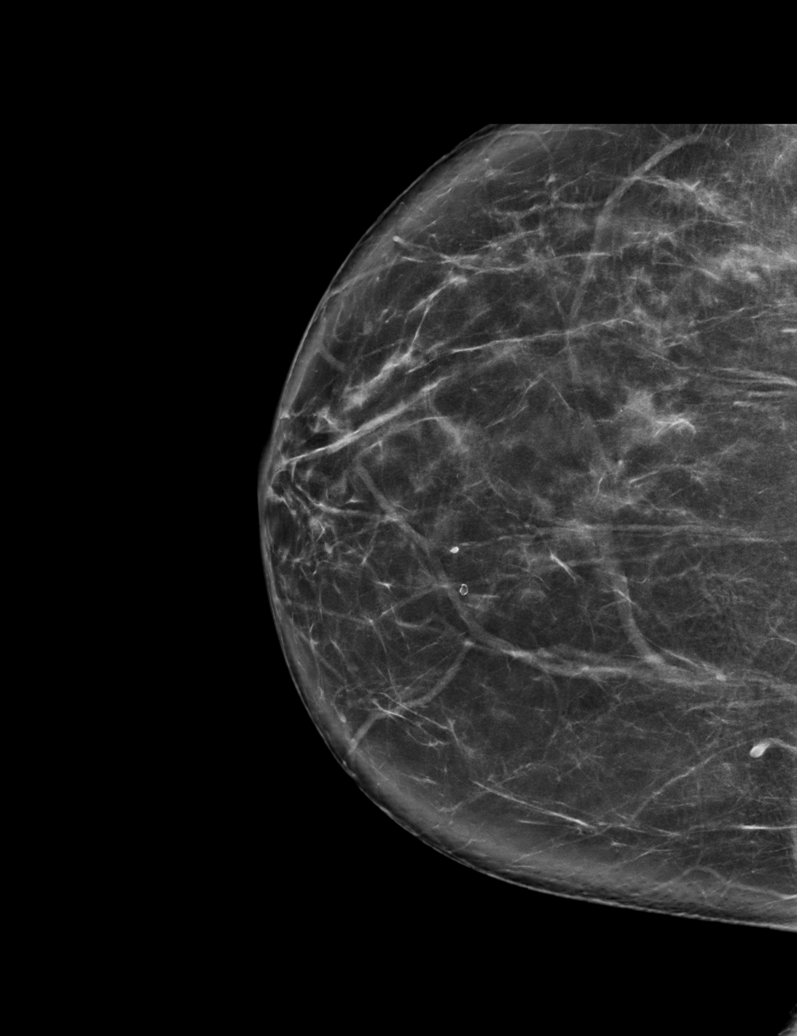

[R MLO synth-2D (1 of 2)]
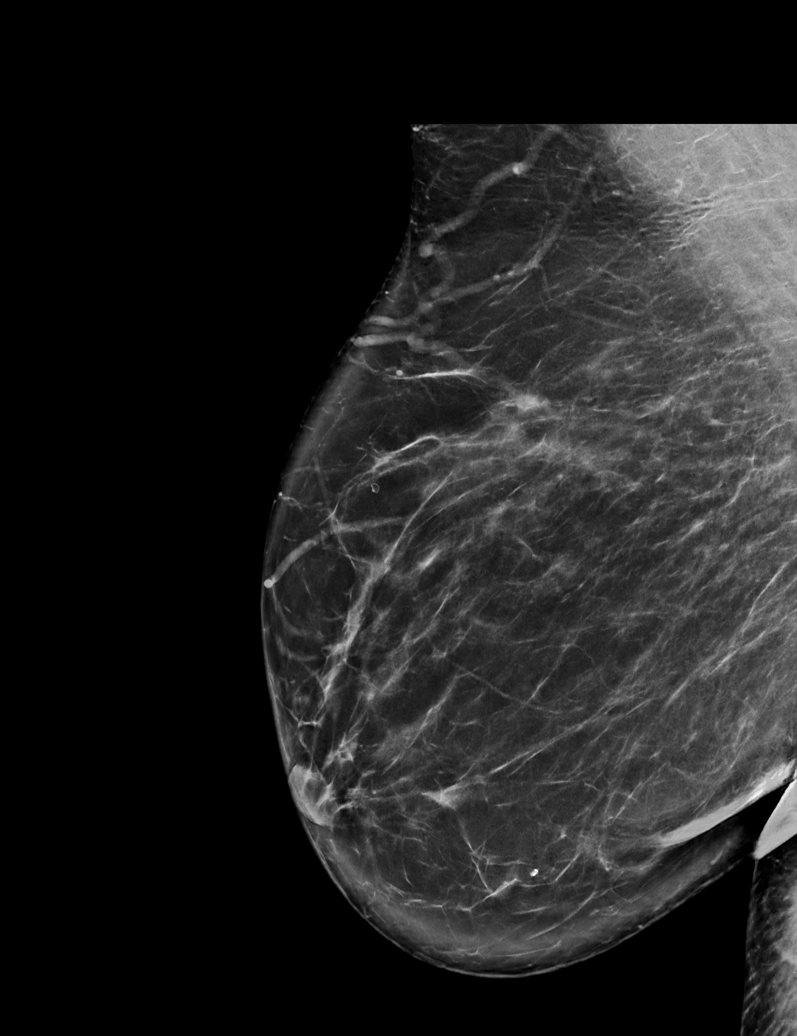

[R MLO synth-2D (2 of 2)]
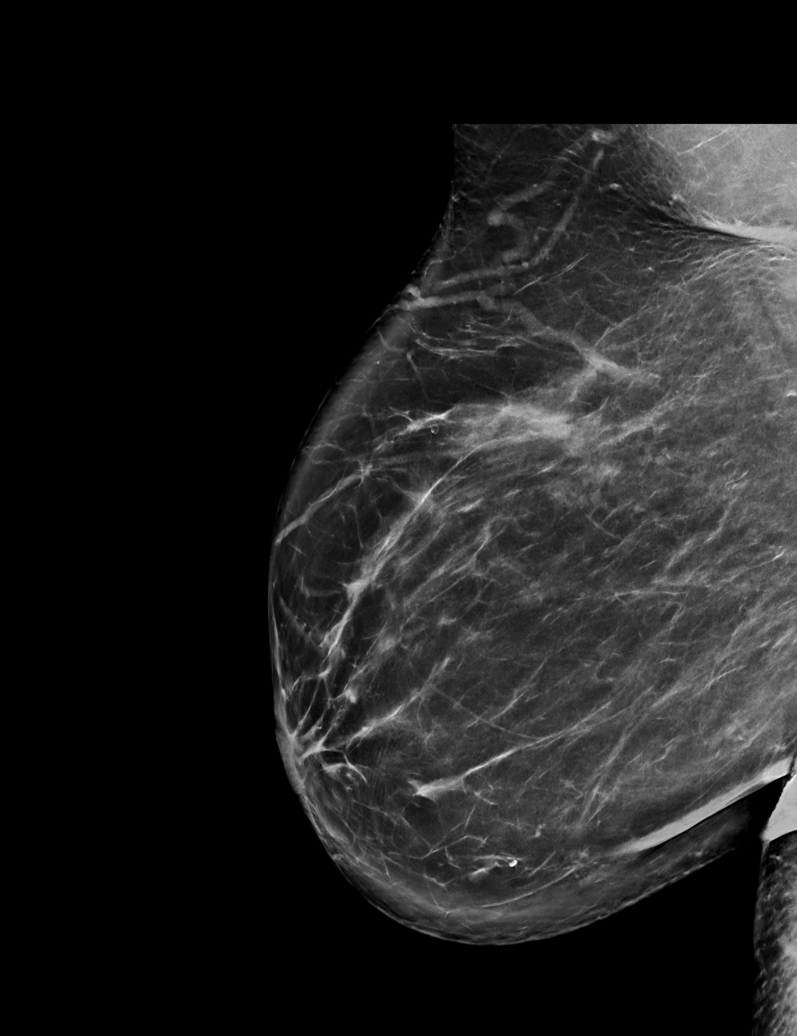

[L CC synth-2D]
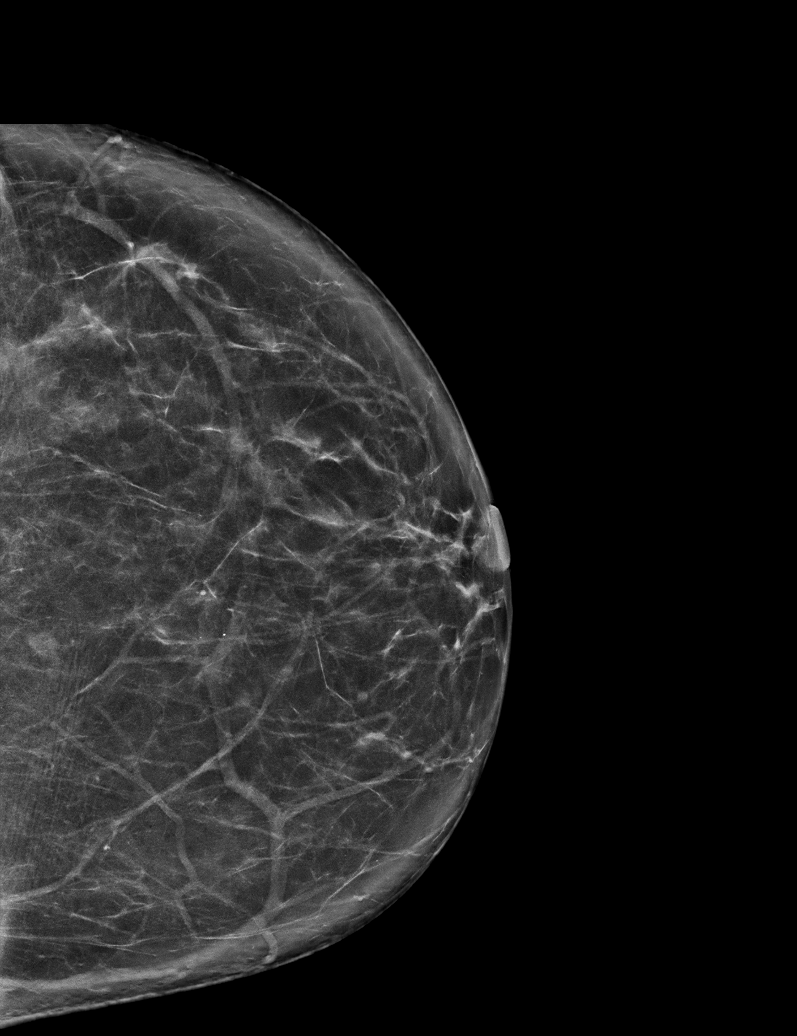

[L MLO synth-2D]
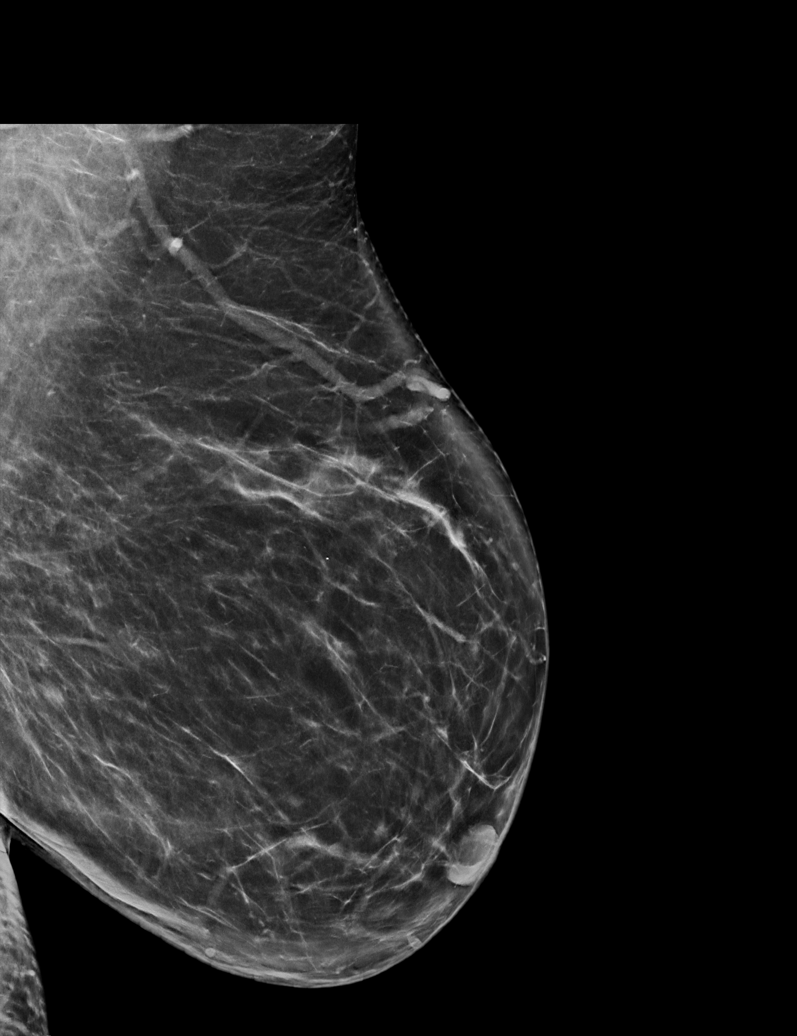

[R CC tomo · tomo slice 39/77.0]
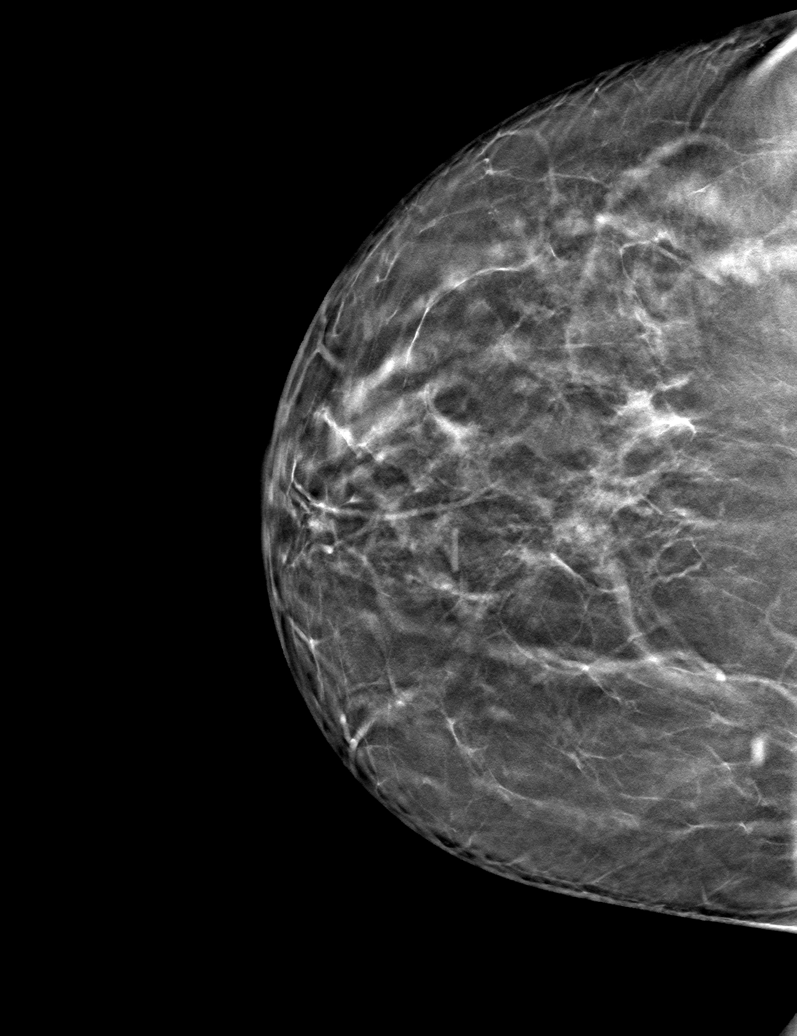

[6 of 30 positions shown; findings below may reference images not displayed]

ACR Breast Density Category b: There are scattered areas of
fibroglandular density.
FINDINGS: There are no findings suspicious for malignancy. The images were
evaluated with computer-aided detection.
IMPRESSION: No mammographic evidence of malignancy. A result letter of this
screening mammogram will be mailed directly to the patient.

RECOMMENDATION:
Screening mammogram in one year. (Code:[id number]-[id number])

BI-RADS CATEGORY  1: Negative.

## 2022-12-21 ENCOUNTER — Other Ambulatory Visit: Payer: Self-pay | Admitting: Primary Care

## 2022-12-21 DIAGNOSIS — Z1231 Encounter for screening mammogram for malignant neoplasm of breast: Secondary | ICD-10-CM

## 2022-12-23 ENCOUNTER — Ambulatory Visit
Admission: RE | Admit: 2022-12-23 | Discharge: 2022-12-23 | Disposition: A | Discharge status: Home / Self Care | Payer: TRICARE For Life (TFL) | Source: Ambulatory Visit

## 2022-12-23 DIAGNOSIS — Z1231 Encounter for screening mammogram for malignant neoplasm of breast: Secondary | ICD-10-CM

## 2023-01-05 ENCOUNTER — Encounter: Payer: Self-pay | Admitting: Radiology

## 2023-01-05 ENCOUNTER — Ambulatory Visit (INDEPENDENT_AMBULATORY_CARE_PROVIDER_SITE_OTHER): Payer: TRICARE For Life (TFL) | Admitting: Radiology

## 2023-01-05 ENCOUNTER — Other Ambulatory Visit (HOSPITAL_COMMUNITY)
Admission: RE | Admit: 2023-01-05 | Discharge: 2023-01-05 | Disposition: A | Discharge status: Home / Self Care | Source: Ambulatory Visit | Attending: Radiology | Admitting: Radiology

## 2023-01-05 VITALS — BP 136/88 | Ht 62.0 in | Wt 192.0 lb

## 2023-01-05 DIAGNOSIS — Z803 Family history of malignant neoplasm of breast: Secondary | ICD-10-CM

## 2023-01-05 DIAGNOSIS — N760 Acute vaginitis: Secondary | ICD-10-CM | POA: Diagnosis not present

## 2023-01-05 DIAGNOSIS — Z01419 Encounter for gynecological examination (general) (routine) without abnormal findings: Secondary | ICD-10-CM | POA: Diagnosis not present

## 2023-01-05 DIAGNOSIS — N958 Other specified menopausal and perimenopausal disorders: Secondary | ICD-10-CM | POA: Diagnosis not present

## 2023-01-05 DIAGNOSIS — Z79890 Hormone replacement therapy (postmenopausal): Secondary | ICD-10-CM

## 2023-01-05 LAB — WET PREP FOR TRICH, YEAST, CLUE

## 2023-01-05 MED ORDER — IMVEXXY MAINTENANCE PACK 10 MCG VA INST: 1.0000 | VAGINAL_INSERT | VAGINAL | 11 refills | Status: DC

## 2023-01-05 MED ORDER — FLUCONAZOLE 150 MG PO TABS: 150.0000 mg | ORAL_TABLET | ORAL | 0 refills | Status: DC

## 2023-01-05 MED ORDER — NYSTATIN-TRIAMCINOLONE 100000-0.1 UNIT/GM-% EX OINT: 1.0000 | TOPICAL_OINTMENT | Freq: Two times a day (BID) | CUTANEOUS | 0 refills | Status: DC

## 2023-01-05 MED ORDER — ESTRADIOL 0.05 MG/24HR TD PTTW: 1.0000 | MEDICATED_PATCH | TRANSDERMAL | 4 refills | Status: DC

## 2023-01-05 MED ORDER — PROGESTERONE MICRONIZED 100 MG PO CAPS: 100.0000 mg | ORAL_CAPSULE | Freq: Every evening | ORAL | 4 refills | Status: DC

## 2023-01-05 NOTE — Progress Notes (Signed)
Alexis Patel 1966-07-16 EO:[id number]   History: Postmenopausal 57 y.o. presents for annual exam as a new patient.Complains of vaginal itching, dryness, pain with intercourse. Wants to discuss menopause treatment. Currently using Estrogen patch 0.05 and Progesterone '100mg'$ , doing well, need a refill, originally prescribed by [facility].  Wants to discuss having genetic cancer screening (mom dec from breast cancer (over 78 at onset), P Aunt breast cancer (under 50 at onset).    Gynecologic History Postmenopausal Last Pap: 2019. Results were: normal Last mammogram: 12/23/2022. Results were: normal HRT use: current  Obstetric History OB History  Gravida Para Term Preterm AB Living  '2 2       2  '$ SAB IAB Ectopic Multiple Live Births               # Outcome Date GA Lbr Len/2nd Weight Sex Delivery Anes PTL Lv  2 Para           1 Para              The following portions of the patient's history were reviewed and updated as appropriate: allergies, current medications, past family history, past medical history, past social history, past surgical history, and problem list.  Review of Systems Pertinent items noted in HPI and remainder of comprehensive ROS otherwise negative.  Past medical history, past surgical history, family history and social history were all reviewed and documented in the EPIC chart.  Exam:  Vitals:   01/05/23 1324  BP: 136/88  Weight: 192 lb (87.1 kg)  Height: '5\' 2"'$  (1.575 m)   Body mass index is 35.12 kg/m.  General appearance:  Normal Thyroid:  Symmetrical, normal in size, without palpable masses or nodularity. Respiratory  Auscultation:  Clear without wheezing or rhonchi Cardiovascular  Auscultation:  Regular rate, without rubs, murmurs or gallops  Edema/varicosities:  Not grossly evident Abdominal  Soft,nontender, without masses, guarding or rebound.  Liver/spleen:  No organomegaly noted  Hernia:  None appreciated  Skin  Inspection:  Grossly  normal Breasts: Examined lying and sitting.   Right: Without masses, retractions, nipple discharge or axillary adenopathy.   Left: Without masses, retractions, nipple discharge or axillary adenopathy. Genitourinary   Inguinal/mons:  Normal without inguinal adenopathy  External genitalia:  erythematous vulva and perineum   BUS/Urethra/Skene's glands:  Normal  Vagina:  Moderately atrophic, with erythema and thick white vaginal discharge, wet mount sample obtained  Cervix:  Normal appearing without discharge or lesions  Uterus:  Normal in size, shape and contour.  Midline and mobile, nontender  Adnexa/parametria:     Rt: Normal in size, without masses or tenderness.   Lt: Normal in size, without masses or tenderness.      Patient informed chaperone available to be present for breast and pelvic exam. Patient has requested no chaperone to be present. Patient has been advised what will be completed during breast and pelvic exam.   Assessment/Plan:   1. Well woman exam with routine gynecological exam - Cytology - PAP( [facility])  2. Family history of breast cancer in first degree relative - Ambulatory referral to Genetics  3. Genitourinary syndrome of menopause - Estradiol (IMVEXXY MAINTENANCE PACK) 10 MCG INST; Place 1 tablet vaginally 2 (two) times a week. At bedtime  Dispense: 8 each; Refill: 11  4. Vulvovaginitis - WET PREP FOR TRICH, YEAST, CLUE + yeast, rx sent for fluconazole - nystatin-triamcinolone ointment (MYCOLOG); Apply 1 Application topically 2 (two) times daily.  Dispense: 30 g; Refill: 0  5. Hormone replacement  therapy (HRT) - estradiol (VIVELLE-DOT) 0.05 MG/24HR patch; Place 1 patch (0.05 mg total) onto the skin 2 (two) times a week.  Dispense: 24 patch; Refill: 4 - progesterone (PROMETRIUM) 100 MG capsule; Take 1 capsule (100 mg total) by mouth at bedtime.  Dispense: 90 capsule; Refill: 4    Discussed SBE, colonoscopy and DEXA screening as directed. Recommend  19mns of exercise weekly, including weight bearing exercise. Encouraged the use of seatbelts and sunscreen.  Return in 1 year for annual or sooner prn.  CRubbie BattiestB WHNP-BC, 1:49 PM 01/05/2023

## 2023-01-08 ENCOUNTER — Telehealth: Payer: Self-pay | Admitting: Genetic Counselor

## 2023-01-08 NOTE — Telephone Encounter (Signed)
Scheduled appt per 2/27 referral. Pt is aware of appt date and time. Pt is aware to arrive 15 mins prior to appt time and to bring and updated insurance card. Pt is aware of appt location.   °

## 2023-01-12 ENCOUNTER — Telehealth: Payer: TRICARE For Life (TFL) | Admitting: Physician Assistant

## 2023-01-12 DIAGNOSIS — B001 Herpesviral vesicular dermatitis: Secondary | ICD-10-CM | POA: Diagnosis not present

## 2023-01-12 LAB — CYTOLOGY - PAP
Adequacy: ABSENT
Diagnosis: NEGATIVE
Diagnosis: REACTIVE

## 2023-01-12 MED ORDER — VALACYCLOVIR HCL 1 G PO TABS: 2000.0000 mg | ORAL_TABLET | Freq: Two times a day (BID) | ORAL | 0 refills | Status: AC

## 2023-01-12 NOTE — Progress Notes (Signed)

## 2023-01-12 NOTE — Progress Notes (Signed)
I have spent 5 minutes in review of e-visit questionnaire, review and updating patient chart, medical decision making and response to patient.   Phares Zaccone Cody Kwinton Maahs, PA-C    

## 2023-02-26 NOTE — Progress Notes (Unsigned)
REFERRING PROVIDER: Tanda Rockers, NP [address] [place],  [place] [address]  PRIMARY PROVIDER:  Doreene Nest, NP  PRIMARY REASON FOR VISIT:  Encounter Diagnosis  Name Primary?   Family history of breast cancer Yes    HISTORY OF PRESENT ILLNESS:   Ms. Hoston, a 57 y.o. female, was seen for a [facility] cancer genetics consultation at the request of Arlie Solomons, NP due to a family history of breast cancer.  Ms. Sakuma presents to clinic today to discuss the possibility of a hereditary predisposition to cancer, to discuss genetic testing, and to further clarify her future cancer risks, as well as potential cancer risks for family members.   Ms. Drzewiecki is a 57 y.o. female with no personal history of cancer.    CANCER HISTORY:  Oncology History   No history exists.     RISK FACTORS:  Mammogram within the last year: yes; Feb 2024; category b density Number of breast biopsies: 0. Colonoscopy: no; not examined.  Hysterectomy: no.  Ovaries intact: yes.  Menarche was at age 60.  First live birth at age 34.  Menopausal status: postmenopausal.  OCP use for less 1 years.  HRT use: current use; 4 months Dermatology screening: no   Past Medical History:  Diagnosis Date   Blood in stool    Hemorrhoids    PONV (postoperative nausea and vomiting)    Thyroid disease     Past Surgical History:  Procedure Laterality Date   CHOLECYSTECTOMY N/A 10/28/2017   Procedure: LAPAROSCOPIC CHOLECYSTECTOMY;  Surgeon: Kinsinger, De Blanch, MD;  Location: [facility];  Service: General;  Laterality: N/A;   orthodontic     teeth removed to make room for braces ; procedure done in tten years      FAMILY HISTORY:  We obtained a detailed, 4-generation family history.  Significant diagnoses are listed below: Family History  Problem Relation Age of Onset   Breast cancer Mother 75       mets d. 6   Breast cancer Paternal Aunt        dx 30s-40s; mets in 77s   Stomach  cancer Paternal Grandmother        dx 48s; or other primary w/ mets?   Stomach cancer Paternal Grandfather        dx <60? or other primary w/ mets?     Ms. Shatz stated that her daughter may have had genetic testing previously.  No report was available for review today.    Patient's maternal ancestors are of [place] descent, and paternal ancestors are of [place] descent. There is no reported Ashkenazi Jewish ancestry. There is no known consanguinity.  GENETIC COUNSELING ASSESSMENT: Ms. Stallone is a 57 y.o. female with a family history which is somewhat suggestive of a hereditary cancer syndrome and predisposition to cancer given the presence of breast cancer before age 13 in her family. We, therefore, discussed and recommended the following at today's visit.   DISCUSSION: We discussed that 5 - 10% of cancer is hereditary, with most cases of hereditary breast cancer associated with mutations in BRCA1/2.  There are other genes that can be associated with hereditary breast cancer syndromes.  These include but are not limited to CHEK2, ATM, and PALB2.  We discussed that testing is beneficial for several reasons, including knowing about other cancer risks, identifying potential screening and risk-reduction options that may be appropriate, and to understanding if other family members could be at risk for cancer and  allowing them to undergo genetic testing.  We reviewed the characteristics, features and inheritance patterns of hereditary cancer syndromes. We also discussed genetic testing, including the appropriate family members to test, the process of testing, insurance coverage and turn-around-time for results. We discussed the implications of a negative, positive, and variant of uncertain significant result. We discussed that negative results would be uninformative given that Ms. Courser does not have a personal history of cancer. We recommended Ms. Moyd pursue genetic testing for a panel that contains genes  associated with breast cancer and other cancers.  Ms. Berquist was offered a common hereditary cancer panel (47 genes) and an expanded pan-cancer panel (71 genes). Ms. Kitner was informed of the benefits and limitations of each panel, including that expanded pan-cancer panels contain several genes that do not have clear management guidelines at this point in time.  We also discussed that as the number of genes included on a panel increases, the chances of variants of uncertain significance increases.  After considering the benefits and limitations of each gene panel, Ms. Hipps elected to have an expanded Psychologist, occupational through [employer]   The CancerNext-Expanded gene panel offered by [facility] and includes sequencing, rearrangement, and RNA analysis for the following 71 genes:  AIP, ALK, APC, ATM, BAP1, BARD1, BMPR1A, BRCA1, BRCA2, BRIP1, CDC73, CDH1, CDK4, CDKN1B, CDKN2A, CHEK2, DICER1, FH, FLCN, KIF1B, LZTR1,MAX, MEN1, MET, MLH1, MSH2, MSH6, MUTYH, NF1, NF2, NTHL1, PALB2, PHOX2B, PMS2, POT1, PRKAR1A, PTCH1, PTEN, RAD51C,RAD51D, RB1, RET, SDHA, SDHAF2, SDHB, SDHC, SDHD, SMAD4, SMARCA4, SMARCB1, SMARCE1, STK11, SUFU, TMEM127, TP53,TSC1, TSC2 and VHL (sequencing and deletion/duplication); AXIN2, CTNNA1, EGFR, EGLN1, HOXB13, KIT, MITF, MSH3, PDGFRA, POLD1 and POLE (sequencing only); EPCAM and GREM1 (deletion/duplication only).   Based on Ms. Baltes's family history of breast cancer, she meets medical criteria for genetic testing. Despite that she meets criteria, she may still have an out of pocket cost. We discussed that if her out of pocket cost for testing is over $100, the laboratory should contact them to discuss self-pay options and/or patient pay assistance programs.   We discussed the Genetic Information Non-Discrimination Act (GINA) of 2008, which helps protect individuals against genetic discrimination based on their genetic test results.  It impacts both health insurance and employment.  With  health insurance, it protects against genetic test results being used for increased premiums or policy termination. For employment, it protects against hiring, firing and promoting decisions based on genetic test results.  GINA does not apply to those in the [employer], those who work for companies with less than 15 employees, and new life insurance or long-term disability insurance policies.  Health status due to a cancer diagnosis is not protected under GINA.  PLAN: After considering the risks, benefits, and limitations, Ms. Branan provided informed consent to pursue genetic testing and the blood sample was sent to [facility] for analysis of the CancerNext-Expanded +RNAinsight Panel. Results should be available within approximately 3 weeks' time, at which point they will be disclosed by telephone to Ms. Corella, as will any additional recommendations warranted by these results. Ms. Seivert will receive a summary of her genetic counseling visit and a copy of her results once available. This information will also be available in Epic.   Ms. Menton questions were answered to her satisfaction today. Our contact information was provided should additional questions or concerns arise. Thank you for the referral and allowing [place] to share in the care of your patient.   Quame Spratlin M. Rennie Plowman, MS, [facility] Genetic  Counselor [email or web address] (P) [phone]   The patient was seen for a total of 30 minutes in face-to-face genetic counseling.  The patient was seen alone.  Drs. Pamelia Hoit and/or Mosetta Putt were available to discuss this case as needed.  _______________________________________________________________________ For Office Staff:  Number of people involved in session: 1 Was an Intern/ student involved with case: no

## 2023-03-01 ENCOUNTER — Inpatient Hospital Stay: Attending: Genetic Counselor | Admitting: Genetic Counselor

## 2023-03-01 ENCOUNTER — Encounter: Payer: Self-pay | Admitting: Genetic Counselor

## 2023-03-01 ENCOUNTER — Inpatient Hospital Stay

## 2023-03-01 DIAGNOSIS — Z803 Family history of malignant neoplasm of breast: Secondary | ICD-10-CM

## 2023-03-01 LAB — GENETIC SCREENING ORDER

## 2023-03-19 ENCOUNTER — Telehealth: Payer: Self-pay | Admitting: Genetic Counselor

## 2023-03-19 ENCOUNTER — Encounter: Payer: Self-pay | Admitting: Genetic Counselor

## 2023-03-19 DIAGNOSIS — Z1379 Encounter for other screening for genetic and chromosomal anomalies: Secondary | ICD-10-CM | POA: Insufficient documentation

## 2023-03-19 NOTE — Telephone Encounter (Signed)
Contacted patient in attempt to disclose results of genetic testing.  LVM with contact information requesting a call back.  

## 2023-03-30 NOTE — Telephone Encounter (Signed)
Contacted patient in attempt to disclose results of genetic testing.  LVM with contact information requesting a call back.  Second attempt.  

## 2023-04-07 ENCOUNTER — Ambulatory Visit: Payer: Self-pay | Admitting: Radiology

## 2023-04-09 ENCOUNTER — Ambulatory Visit: Payer: Self-pay | Admitting: Genetic Counselor

## 2023-04-09 DIAGNOSIS — Z803 Family history of malignant neoplasm of breast: Secondary | ICD-10-CM

## 2023-04-09 DIAGNOSIS — Z1379 Encounter for other screening for genetic and chromosomal anomalies: Secondary | ICD-10-CM

## 2023-04-09 NOTE — Telephone Encounter (Signed)
Third attempt. Contacted patient in attempt to disclose results of genetic testing.  LVM with contact information and explained results in MyChart.  Encouraged to reach out with questions/to discuss further.

## 2023-04-09 NOTE — Progress Notes (Signed)
HPI:   Ms. Lovelady was previously seen in the [facility] due to a family history of breast cancer and concerns regarding a hereditary predisposition to cancer.    Ms. Boatner recent genetic test results were disclosed to her by MyChart. These results and recommendations are discussed in more detail below.  CANCER HISTORY:  Oncology History   No history exists.    FAMILY HISTORY:  We obtained a detailed, 4-generation family history.  Significant diagnoses are listed below:      Family History  Problem Relation Age of Onset   Breast cancer Mother 29        mets d. 77   Breast cancer Paternal Aunt          dx 30s-40s; mets in 57s   Stomach cancer Paternal Grandmother          dx 89s; or other primary w/ mets?   Stomach cancer Paternal Grandfather          dx <60? or other primary w/ mets?       Ms. Mondry stated that her daughter may have had genetic testing previously.  No report was available for review today.     Patient's maternal ancestors are of [place] descent, and paternal ancestors are of [place] descent. There is no reported Ashkenazi Jewish ancestry. There is no known consanguinity.  GENETIC TEST RESULTS:  The [facility] CancerNext-Expanded +RNAinsight Panel found no pathogenic mutations.   The CancerNext-Expanded gene panel offered by [facility] and includes sequencing, rearrangement, and RNA analysis for the following 71 genes:  AIP, ALK, APC, ATM, BAP1, BARD1, BMPR1A, BRCA1, BRCA2, BRIP1, CDC73, CDH1, CDK4, CDKN1B, CDKN2A, CHEK2, DICER1, FH, FLCN, KIF1B, LZTR1,MAX, MEN1, MET, MLH1, MSH2, MSH6, MUTYH, NF1, NF2, NTHL1, PALB2, PHOX2B, PMS2, POT1, PRKAR1A, PTCH1, PTEN, RAD51C,RAD51D, RB1, RET, SDHA, SDHAF2, SDHB, SDHC, SDHD, SMAD4, SMARCA4, SMARCB1, SMARCE1, STK11, SUFU, TMEM127, TP53,TSC1, TSC2 and VHL (sequencing and deletion/duplication); AXIN2, CTNNA1, EGFR, EGLN1, HOXB13, KIT, MITF, MSH3, PDGFRA, POLD1 and POLE (sequencing only); EPCAM and GREM1  (deletion/duplication only).   The test report has been scanned into EPIC and is located under the Molecular Pathology section of the Results Review tab.  A portion of the result report is included below for reference. Genetic testing reported out on Mar 11, 2023.      Even though a pathogenic variant was not identified, possible explanations for the cancer in the family may include: There may be no hereditary risk for cancer in the family. The cancers in Ms. Wedge's family may be sporadic/familial or due to other genetic and environmental factors. There may be a gene mutation in one of these genes that current testing methods cannot detect but that chance is small. There could be another gene that has not yet been discovered, or that we have not yet tested, that is responsible for the cancer diagnoses in the family.  It is also possible there is a hereditary cause for the cancer in the family that Ms. Becherer did not inherit.   Therefore, it is important to remain in touch with cancer genetics in the future so that we can continue to offer Ms. Shone the most up to date genetic testing.    ADDITIONAL GENETIC TESTING:   Ms. Naro genetic testing was fairly extensive.  If there are additional relevant genes identified to increase cancer risk that can be analyzed in the future, we would be happy to discuss and coordinate this testing at that time.    CANCER  SCREENING RECOMMENDATIONS:  Ms. Tepedino test result is considered negative (normal).  This means that we have not identified a hereditary cause for her family history of cancer at this time.   An individual's cancer risk and medical management are not determined by genetic test results alone. Overall cancer risk assessment incorporates additional factors, including personal medical history, family history, and any available genetic information that may result in a personalized plan for cancer prevention and surveillance. Therefore, it is  recommended she continue to follow the cancer management and screening guidelines provided by her primary healthcare provider.  Her lifetime risk for breast cancer based on Tyrer-Cuzick risk model and reported personal/family history is 11.3%.  This is elevated compared to the general population (adjusted for age) but not in the 'high risk for breast cancer' category per NCCN guidelines (>20%).  This risk estimate can change over time and may be repeated to reflect new information in her personal or family history in the future.      RECOMMENDATIONS FOR FAMILY MEMBERS:   Since she did not inherit a identifiable mutation in a cancer predisposition gene included on this panel, her children could not have inherited a known mutation from her in one of these genes. Individuals in this family might be at some increased risk of developing cancer, over the general population risk, due to the family history of cancer.  Individuals in the family should notify their providers of the family history of cancer. We recommend women in this family have a yearly mammogram beginning at age 51, or 104 years younger than the earliest onset of cancer, an annual clinical breast exam, and perform monthly breast self-exams.  Risk models that take into account family history and hormonal history may be helpful in determining appropriate breast cancer screening options for family members. Female relatives should speak with their providers about prostate cancer screening at the appropriate age.  Other members of the family may still carry a pathogenic variant in one of these genes that Ms. Chabot did not inherit. Based on her deceased mother's history of breast cancer before age 51, we recommend her siblings have genetic counseling and testing. Ms. Bedsworth can let us know if we can be of any assistance in coordinating genetic counseling and/or testing for these family members.     FOLLOW-UP:  Cancer genetics is a rapidly advancing field  and it is possible that new genetic tests will be appropriate for her and/or her family members in the future. We encourage Ms. Wilford to remain in contact with cancer genetics, so we can update her personal and family histories and let her know of advances in cancer genetics that may benefit this family.   Our contact number was provided.. she knows she is welcome to call us at anytime with additional questions or concerns.   Kanita Delage M. Rennie Plowman, MS, [facility] Genetic Counselor [email or web address] (P) [phone]

## 2023-06-15 ENCOUNTER — Encounter: Admitting: Primary Care

## 2023-11-12 ENCOUNTER — Other Ambulatory Visit: Payer: Self-pay | Admitting: Primary Care

## 2023-11-12 DIAGNOSIS — Z1231 Encounter for screening mammogram for malignant neoplasm of breast: Secondary | ICD-10-CM

## 2023-12-01 ENCOUNTER — Ambulatory Visit (INDEPENDENT_AMBULATORY_CARE_PROVIDER_SITE_OTHER): Admitting: Radiology

## 2023-12-01 VITALS — BP 136/88 | HR 99

## 2023-12-01 DIAGNOSIS — R3989 Other symptoms and signs involving the genitourinary system: Secondary | ICD-10-CM

## 2023-12-01 DIAGNOSIS — N760 Acute vaginitis: Secondary | ICD-10-CM | POA: Diagnosis not present

## 2023-12-01 DIAGNOSIS — N952 Postmenopausal atrophic vaginitis: Secondary | ICD-10-CM | POA: Diagnosis not present

## 2023-12-01 DIAGNOSIS — N958 Other specified menopausal and perimenopausal disorders: Secondary | ICD-10-CM

## 2023-12-01 LAB — URINALYSIS, COMPLETE W/RFL CULTURE
Bacteria, UA: NONE SEEN /[HPF]
Bilirubin Urine: NEGATIVE
Hgb urine dipstick: NEGATIVE
Hyaline Cast: NONE SEEN /[LPF]
Leukocyte Esterase: NEGATIVE
Nitrites, Initial: NEGATIVE
Protein, ur: NEGATIVE
RBC / HPF: NONE SEEN /[HPF] (ref 0–2)
Specific Gravity, Urine: 1.01 (ref 1.001–1.035)
WBC, UA: NONE SEEN /[HPF] (ref 0–5)
pH: 5.5 (ref 5.0–8.0)

## 2023-12-01 LAB — WET PREP FOR TRICH, YEAST, CLUE

## 2023-12-01 LAB — NO CULTURE INDICATED

## 2023-12-01 MED ORDER — ESTRADIOL 0.1 MG/GM VA CREA: 1.0000 g | TOPICAL_CREAM | VAGINAL | 2 refills | Status: DC

## 2023-12-01 MED ORDER — NYSTATIN-TRIAMCINOLONE 100000-0.1 UNIT/GM-% EX OINT: 1.0000 | TOPICAL_OINTMENT | Freq: Two times a day (BID) | CUTANEOUS | 0 refills | Status: DC

## 2023-12-01 MED ORDER — FLUCONAZOLE 150 MG PO TABS: 150.0000 mg | ORAL_TABLET | Freq: Once | ORAL | 0 refills | Status: AC

## 2023-12-01 NOTE — Progress Notes (Signed)
      Subjective: Alexis Patel is a 58 y.o. female who complains of vaginal light brown/pink bleeding. Started last Friday after using monistat 3 and continued over the weekend. Complains of bladder pressure, no dysuria, no frequency, no urgency.  She is on HRT, has not had any bleeding previous since being on HRT.   Review of Systems  All other systems reviewed and are negative.   Past Medical History:  Diagnosis Date   Blood in stool    Hemorrhoids    PONV (postoperative nausea and vomiting)    Thyroid disease       Objective:  Today's Vitals   12/01/23 0948  BP: 136/88  Pulse: 99  SpO2: 99%   There is no height or weight on file to calculate BMI.   Physical Exam Vitals and nursing note reviewed. Exam conducted with a chaperone present.  Constitutional:      Appearance: Normal appearance. She is well-developed.  Pulmonary:     Effort: Pulmonary effort is normal.  Abdominal:     General: Abdomen is flat.     Palpations: Abdomen is soft.  Genitourinary:    General: Normal vulva.     Vagina: Vaginal discharge and erythema present. No bleeding or lesions.     Cervix: Normal. No discharge, friability, lesion or erythema.     Uterus: Normal.      Adnexa: Right adnexa normal and left adnexa normal.  Neurological:     Mental Status: She is alert.  Psychiatric:        Mood and Affect: Mood normal.        Thought Content: Thought content normal.        Judgment: Judgment normal.    Urine dipstick shows positive for glucose and positive for ketones.  Micro exam: negative for WBC's or RBC's.  Microscopic wet-mount exam shows negative for pathogens, normal epithelial cells.   Raynelle Fanning, CMA present for exam  Assessment:/Plan:   1. Sensation of pressure in bladder area (Primary) Reassured  negative ua - Urinalysis,Complete w/RFL Culture  2. Vulvovaginitis  - WET PREP FOR TRICH, YEAST, CLUE - fluconazole 150mg  po once - nystatin-triamcinolone ointment  (MYCOLOG); Apply 1 Application topically 2 (two) times daily.  Dispense: 30 g; Refill: 0  3. Genitourinary syndrome of menopause Will change to estrace cream 3 times a week to heal vaginal tissues - estradiol (ESTRACE VAGINAL) 0.1 MG/GM vaginal cream; Place 1 g vaginally 3 (three) times a week.  Dispense: 42.5 g; Refill: 2   If bleeding returns will schedule u/s. Likely from atrophy and excoriation.  Jacobe Study B, NP 9:59 AM

## 2023-12-16 ENCOUNTER — Ambulatory Visit: Admitting: Physician Assistant

## 2023-12-16 ENCOUNTER — Encounter: Payer: Self-pay | Admitting: Physician Assistant

## 2023-12-16 VITALS — BP 133/85 | HR 81 | Resp 18 | Ht 63.0 in | Wt 179.2 lb

## 2023-12-16 DIAGNOSIS — R5383 Other fatigue: Secondary | ICD-10-CM

## 2023-12-16 DIAGNOSIS — Z23 Encounter for immunization: Secondary | ICD-10-CM | POA: Diagnosis not present

## 2023-12-16 DIAGNOSIS — I10 Essential (primary) hypertension: Secondary | ICD-10-CM

## 2023-12-16 DIAGNOSIS — Z780 Asymptomatic menopausal state: Secondary | ICD-10-CM

## 2023-12-16 DIAGNOSIS — E049 Nontoxic goiter, unspecified: Secondary | ICD-10-CM | POA: Diagnosis not present

## 2023-12-16 DIAGNOSIS — K5909 Other constipation: Secondary | ICD-10-CM

## 2023-12-16 DIAGNOSIS — Z1211 Encounter for screening for malignant neoplasm of colon: Secondary | ICD-10-CM

## 2023-12-16 DIAGNOSIS — Z7689 Persons encountering health services in other specified circumstances: Secondary | ICD-10-CM

## 2023-12-16 DIAGNOSIS — E038 Other specified hypothyroidism: Secondary | ICD-10-CM

## 2023-12-16 DIAGNOSIS — Z1159 Encounter for screening for other viral diseases: Secondary | ICD-10-CM

## 2023-12-16 MED ORDER — LIOTHYRONINE SODIUM 5 MCG PO TABS: 5.0000 ug | ORAL_TABLET | Freq: Every day | ORAL | 0 refills | Status: DC

## 2023-12-16 NOTE — Progress Notes (Signed)
 New patient visit  Patient: Alexis Patel   DOB: [birth year]   57 y.o. Female  MRN: [id number] Visit Date: 12/16/2023  Today's healthcare provider: Armonee Bojanowski, PA-C   Chief Complaint  Patient presents with   Establish Care    Needs refill of thyroid  medicine, but not sure if she needs an endocrinologist   Subjective    Alexis Patel is a 58 y.o. female who presents today as a new patient to establish care.   Discussed the use of AI scribe software for clinical note transcription with the patient, who gave verbal consent to proceed.  History of Present Illness   The patient, with a history of hypothyroidism, is seeking to establish care after being dropped from her previous practice due to COVID-19 related issues. She has been on Cytomel  (liothyronine ) for her thyroid  condition, which was prescribed by a hormone replacement clinic. The patient is unsure why she was prescribed Cytomel  instead of levothyroxine, which is more commonly used for hypothyroidism.  In addition to hypothyroidism, the patient is also undergoing hormone replacement therapy for menopause. She is on an estrogen patch and progesterone  suppositories. She was recently prescribed a cream for a yeast infection, which she believes is due to atrophy.  The patient also mentions occasional constipation and has been advised to follow a high fiber diet. She has a family history of thyroid  issues, with both sisters having Hashimoto's disease. The patient is self-employed and runs a Barista business.        Past Medical History:  Diagnosis Date   Blood in stool    Hemorrhoids    PONV (postoperative nausea and vomiting)    Thyroid  disease    Past Surgical History:  Procedure Laterality Date   CHOLECYSTECTOMY N/A 10/28/2017   Procedure: LAPAROSCOPIC CHOLECYSTECTOMY;  Surgeon: Kinsinger, Herlene Righter, MD;  Location: [facility];  Service: General;  Laterality: N/A;   orthodontic     teeth removed to make room for braces ;  procedure done in tten years    Family Status  Relation Name Status   Mother  Deceased   Father  Deceased   Sister  (Not Specified)   Sister  (Not Specified)   Bruna Aunt  Deceased   PGM  (Not Specified)   PGF  (Not Specified)   Neg Hx  (Not Specified)  No partnership data on file   Family History  Problem Relation Age of Onset   Breast cancer Mother 86       mets d. 40   Arthritis Father    Hyperlipidemia Father    Stroke Father    Hypertension Father    Arthritis Sister    Hyperlipidemia Sister    Diabetes Sister    Breast cancer Paternal Aunt        dx 30s-40s; mets in 8s   Stomach cancer Paternal Grandmother        dx 6s; or other primary w/ mets?   Stomach cancer Paternal Grandfather        dx <60? or other primary w/ mets?   Thyroid  disease Neg Hx        neice   Social History   Socioeconomic History   Marital status: Married    Spouse name: Not on file   Number of children: Not on file   Years of education: Not on file   Highest education level: 12th grade  Occupational History   Not on file  Tobacco Use   Smoking status: Never  Passive exposure: Past   Smokeless tobacco: Never  Vaping Use   Vaping status: Never Used  Substance and Sexual Activity   Alcohol use: No   Drug use: No   Sexual activity: Yes    Partners: Male    Birth control/protection: Post-menopausal    Comment: menarche 58yo, sexual debut 58yo  Other Topics Concern   Not on file  Social History Narrative   Married.   2 children. 6 grandchildren.   Works as a futures trader.   Enjoys making handmade soaps, spending time with family.    Social Drivers of Corporate Investment Banker Strain: Low Risk  (12/16/2023)   Overall Financial Resource Strain (CARDIA)    Difficulty of Paying Living Expenses: Not hard at all  Food Insecurity: No Food Insecurity (12/16/2023)   Hunger Vital Sign    Worried About Running Out of Food in the Last Year: Never true    Ran Out of Food in the Last Year:  Never true  Transportation Needs: No Transportation Needs (12/16/2023)   PRAPARE - Administrator, Civil Service (Medical): No    Lack of Transportation (Non-Medical): No  Physical Activity: Insufficiently Active (12/16/2023)   Exercise Vital Sign    Days of Exercise per Week: 4 days    Minutes of Exercise per Session: 30 min  Stress: No Stress Concern Present (12/16/2023)   [employer] of Occupational Health - Occupational Stress Questionnaire    Feeling of Stress : Only a little  Social Connections: Socially Integrated (12/16/2023)   Social Connection and Isolation Panel [NHANES]    Frequency of Communication with Friends and Family: More than three times a week    Frequency of Social Gatherings with Friends and Family: Once a week    Attends Religious Services: More than 4 times per year    Active Member of [employer] or Organizations: Yes    Attends Engineer, Structural: More than 4 times per year    Marital Status: Married   Outpatient Medications Prior to Visit  Medication Sig   B Complex Vitamins (B COMPLEX PO) Take by mouth.   Cholecalciferol (VITAMIN D-3 PO) Take by mouth.   estradiol  (ESTRACE  VAGINAL) 0.1 MG/GM vaginal cream Place 1 g vaginally 3 (three) times a week.   estradiol  (VIVELLE -DOT) 0.05 MG/24HR patch Place 1 patch (0.05 mg total) onto the skin 2 (two) times a week.   fluticasone (FLONASE) 50 MCG/ACT nasal spray Place 1 spray into both nostrils daily.   liothyronine  (CYTOMEL ) 5 MCG tablet    Magnesium 125 MG CAPS Take by mouth.   Multiple Vitamin (MULTIVITAMIN PO) Take by mouth.   nystatin -triamcinolone  ointment (MYCOLOG) Apply 1 Application topically 2 (two) times daily.   progesterone  (PROMETRIUM ) 100 MG capsule Take 1 capsule (100 mg total) by mouth at bedtime.   TURMERIC PO Take by mouth.   No facility-administered medications prior to visit.   Allergies  Allergen Reactions   Codeine Nausea And Vomiting    Upset stomach    Immunization  History  Administered Date(s) Administered   PFIZER(Purple Top)SARS-COV-2 Vaccination 02/04/2020, 02/27/2020   Tdap 12/16/2023    Health Maintenance  Topic Date Due   HIV Screening  Never done   Hepatitis C Screening  Never done   Colonoscopy  Never done   Zoster Vaccines- Shingrix (1 of 2) Never done   COVID-19 Vaccine (3 - 2024-25 season) 07/11/2023   INFLUENZA VACCINE  02/07/2024 (Originally 06/10/2023)   MAMMOGRAM  12/23/2024  Cervical Cancer Screening (HPV/Pap Cotest)  01/05/2026   DTaP/Tdap/Td (2 - Td or Tdap) 12/15/2033   HPV VACCINES  Aged Out    Patient Care Team: Joane Postel, PA-C as PCP - General (Physician Assistant) Chrzanowski, Jami B, NP as Nurse Practitioner (Radiology)  Review of Systems  All other systems reviewed and are negative.  Except see HPI       Objective    BP 133/85   Pulse 81   Resp 18   Ht 5' 3 (1.6 m)   Wt 179 lb 3.2 oz (81.3 kg)   LMP 11/28/2018   SpO2 95%   BMI 31.74 kg/m     Physical Exam Vitals reviewed.  Constitutional:      General: She is not in acute distress.    Appearance: Normal appearance. She is well-developed. She is obese. She is not diaphoretic.  HENT:     Head: Normocephalic and atraumatic.  Eyes:     General: No scleral icterus.    Conjunctiva/sclera: Conjunctivae normal.  Neck:     Thyroid : No thyromegaly.  Cardiovascular:     Rate and Rhythm: Normal rate and regular rhythm.     Pulses: Normal pulses.     Heart sounds: Normal heart sounds. No murmur heard. Pulmonary:     Effort: Pulmonary effort is normal. No respiratory distress.     Breath sounds: Normal breath sounds. No wheezing, rhonchi or rales.  Musculoskeletal:     Cervical back: Neck supple.     Right lower leg: No edema.     Left lower leg: No edema.  Lymphadenopathy:     Cervical: No cervical adenopathy.  Skin:    General: Skin is warm and dry.     Findings: No rash.  Neurological:     Mental Status: She is alert and oriented to  person, place, and time. Mental status is at baseline.  Psychiatric:        Mood and Affect: Mood normal.        Behavior: Behavior normal.     Depression Screen    12/16/2023   10:25 AM  PHQ 2/9 Scores  PHQ - 2 Score 0  PHQ- 9 Score 1   No results found for any visits on 12/16/23.  Assessment & Plan     Primary hypertension (Primary) - Lipid panel - Hemoglobin A1c - Comprehensive metabolic panel - CBC with Differential/Platelet - TSH+T4F+T3Free  Enlarged thyroid  - Lipid panel - Hemoglobin A1c - Comprehensive metabolic panel - CBC with Differential/Platelet - TSH+T4F+T3Free  Morbid obesity (HCC) Other fatigue Chronic Initial workup - Lipid panel - Hemoglobin A1c - Comprehensive metabolic panel - CBC with Differential/Platelet - TSH+T4F+T3Free Weight loss of 5% of pt's current weight via healthy diet and daily exercise encouraged. Will follow-up  Colon cancer screening Low risk screening - Cologuard - Change Resulting Agency to [facility]  Need for hepatitis C screening test Low risk screening - Hepatitis C antibody  Need for tetanus, diphtheria, and acellular pertussis (Tdap) vaccine in patient of adolescent age or older - Administer Tetanus-diphtheria-acellular pertussis (Tdap) vaccine     Hypothyroidism/enlarged thyroid  On Cytomel  5mcg daily, prescribed by a hormone replacement clinic. Unclear why Cytomel  was chosen over levothyroxine. -Order thyroid  panel to assess current thyroid  function. -Continue Cytomel  5mcg daily until lab results are reviewed.  Menopause On estrogen patch and progesterone  suppository for symptom management. Recently prescribed cream for atrophic vaginitis. -Continue current hormone replacement therapy.  Constipation Reports occasional constipation. -Recommend high fiber diet and increased  water intake.  Hypertension Blood pressure noted to be high during visit. -Recommend DASH diet and Mediterranean diet for  cardiovascular health. -Check renal function and electrolytes.  Preventive Health -Administer tetanus vaccine today. -Order Cologuard for colorectal cancer screening. -Consider shingles vaccine at next visit. -Check liver function, hemoglobin, and HIV and Hepatitis C screening.  Plan for follow-up in 6 weeks for physical exam and review of lab results.      Encounter to establish care Welcomed to our clinic Reviewed past medical hx, social hx, family hx and surgical hx Pt advised to send all vaccination records or screening   Return in about 6 weeks (around 01/27/2024) for CPE.    The patient was advised to call back or seek an in-person evaluation if the symptoms worsen or if the condition fails to improve as anticipated.  I discussed the assessment and treatment plan with the patient. The patient was provided an opportunity to ask questions and all were answered. The patient agreed with the plan and demonstrated an understanding of the instructions.  I, Benetta Maclaren, PA-C have reviewed all documentation for this visit. The documentation on  12/16/2023   for the exam, diagnosis, procedures, and orders are all accurate and complete.  Jolynn Spencer, [facility], MMS [facility] [phone] (phone) [phone] (fax)  [facility]

## 2023-12-17 ENCOUNTER — Encounter: Payer: Self-pay | Admitting: Physician Assistant

## 2023-12-17 ENCOUNTER — Telehealth: Payer: Self-pay | Admitting: Physician Assistant

## 2023-12-17 ENCOUNTER — Other Ambulatory Visit: Payer: Self-pay | Admitting: Physician Assistant

## 2023-12-17 DIAGNOSIS — E1365 Other specified diabetes mellitus with hyperglycemia: Secondary | ICD-10-CM

## 2023-12-17 LAB — CBC WITH DIFFERENTIAL/PLATELET
Basophils Absolute: 0.1 10*3/uL (ref 0.0–0.2)
Basos: 1 %
EOS (ABSOLUTE): 0.1 10*3/uL (ref 0.0–0.4)
Eos: 2 %
Hematocrit: 44.9 % (ref 34.0–46.6)
Hemoglobin: 14.9 g/dL (ref 11.1–15.9)
Immature Grans (Abs): 0 10*3/uL (ref 0.0–0.1)
Immature Granulocytes: 0 %
Lymphocytes Absolute: 1.5 10*3/uL (ref 0.7–3.1)
Lymphs: 26 %
MCH: 30.5 pg (ref 26.6–33.0)
MCHC: 33.2 g/dL (ref 31.5–35.7)
MCV: 92 fL (ref 79–97)
Monocytes Absolute: 0.3 10*3/uL (ref 0.1–0.9)
Monocytes: 6 %
Neutrophils Absolute: 3.9 10*3/uL (ref 1.4–7.0)
Neutrophils: 65 %
Platelets: 268 10*3/uL (ref 150–450)
RBC: 4.88 x10E6/uL (ref 3.77–5.28)
RDW: 11.5 % — ABNORMAL LOW (ref 11.7–15.4)
WBC: 6 10*3/uL (ref 3.4–10.8)

## 2023-12-17 LAB — COMPREHENSIVE METABOLIC PANEL
ALT: 19 [IU]/L (ref 0–32)
AST: 14 [IU]/L (ref 0–40)
Albumin: 4.1 g/dL (ref 3.8–4.9)
Alkaline Phosphatase: 128 [IU]/L — ABNORMAL HIGH (ref 44–121)
BUN/Creatinine Ratio: 11 (ref 9–23)
BUN: 7 mg/dL (ref 6–24)
Bilirubin Total: 0.6 mg/dL (ref 0.0–1.2)
CO2: 20 mmol/L (ref 20–29)
Calcium: 9.5 mg/dL (ref 8.7–10.2)
Chloride: 97 mmol/L (ref 96–106)
Creatinine, Ser: 0.66 mg/dL (ref 0.57–1.00)
Globulin, Total: 2.8 g/dL (ref 1.5–4.5)
Glucose: 309 mg/dL — ABNORMAL HIGH (ref 70–99)
Potassium: 4.3 mmol/L (ref 3.5–5.2)
Sodium: 133 mmol/L — ABNORMAL LOW (ref 134–144)
Total Protein: 6.9 g/dL (ref 6.0–8.5)
eGFR: 101 mL/min/{1.73_m2} (ref >59)

## 2023-12-17 LAB — TSH+T4F+T3FREE
Free T4: 1.47 ng/dL (ref 0.82–1.77)
T3, Free: 3 pg/mL (ref 2.0–4.4)
TSH: 0.947 u[IU]/mL (ref 0.450–4.500)

## 2023-12-17 LAB — LIPID PANEL
Chol/HDL Ratio: 4 {ratio} (ref 0.0–4.4)
Cholesterol, Total: 265 mg/dL — ABNORMAL HIGH (ref 100–199)
HDL: 67 mg/dL (ref >39)
LDL Chol Calc (NIH): 166 mg/dL — ABNORMAL HIGH (ref 0–99)
Triglycerides: 179 mg/dL — ABNORMAL HIGH (ref 0–149)
VLDL Cholesterol Cal: 32 mg/dL (ref 5–40)

## 2023-12-17 LAB — HEMOGLOBIN A1C
Est. average glucose Bld gHb Est-mCnc: 358 mg/dL
Hgb A1c MFr Bld: 14.1 % — ABNORMAL HIGH (ref 4.8–5.6)

## 2023-12-17 MED ORDER — OZEMPIC (0.25 OR 0.5 MG/DOSE) 2 MG/3ML ~~LOC~~ SOPN: PEN_INJECTOR | SUBCUTANEOUS | 1 refills | Status: DC

## 2023-12-17 MED ORDER — GLIPIZIDE 5 MG PO TABS: 5.0000 mg | ORAL_TABLET | Freq: Two times a day (BID) | ORAL | 3 refills | Status: DC

## 2023-12-17 MED ORDER — METFORMIN HCL 500 MG PO TABS: 500.0000 mg | ORAL_TABLET | Freq: Two times a day (BID) | ORAL | 3 refills | Status: DC

## 2023-12-17 NOTE — Telephone Encounter (Signed)
 Received a fax from covermymeds for Ozempic  2mg /70ml  Key:  [id number]

## 2023-12-17 NOTE — Progress Notes (Signed)
 Please check if [facility] can do c peptide Please, let pt know that she has  diabetes. metformin  and glipizide  as well as ozempic  will be called in. I need to see her next week.

## 2023-12-20 ENCOUNTER — Other Ambulatory Visit: Payer: Self-pay | Admitting: Physician Assistant

## 2023-12-20 DIAGNOSIS — Z1231 Encounter for screening mammogram for malignant neoplasm of breast: Secondary | ICD-10-CM

## 2023-12-21 NOTE — Telephone Encounter (Signed)
Recieved a fax from covermymeds   Patient is waiting for Ozempic (0.25 or 0.5 MG/DOSE) 2MG  /3ML Pen-Injectors  KEY: [id number]

## 2023-12-22 ENCOUNTER — Other Ambulatory Visit (HOSPITAL_COMMUNITY): Payer: Self-pay

## 2023-12-24 ENCOUNTER — Encounter: Payer: Self-pay | Admitting: Physician Assistant

## 2023-12-24 ENCOUNTER — Ambulatory Visit: Admitting: Physician Assistant

## 2023-12-24 ENCOUNTER — Encounter

## 2023-12-24 VITALS — BP 128/81 | HR 80 | Ht 63.0 in | Wt 180.0 lb

## 2023-12-24 DIAGNOSIS — Z7984 Long term (current) use of oral hypoglycemic drugs: Secondary | ICD-10-CM | POA: Diagnosis not present

## 2023-12-24 DIAGNOSIS — Z1231 Encounter for screening mammogram for malignant neoplasm of breast: Secondary | ICD-10-CM

## 2023-12-24 DIAGNOSIS — E119 Type 2 diabetes mellitus without complications: Secondary | ICD-10-CM

## 2023-12-24 DIAGNOSIS — E063 Autoimmune thyroiditis: Secondary | ICD-10-CM

## 2023-12-24 DIAGNOSIS — E1169 Type 2 diabetes mellitus with other specified complication: Secondary | ICD-10-CM

## 2023-12-24 MED ORDER — METFORMIN HCL 500 MG PO TABS: 500.0000 mg | ORAL_TABLET | Freq: Two times a day (BID) | ORAL | 3 refills | Status: DC

## 2023-12-24 MED ORDER — GLIPIZIDE 5 MG PO TABS: 5.0000 mg | ORAL_TABLET | Freq: Two times a day (BID) | ORAL | 3 refills | Status: DC

## 2023-12-24 MED ORDER — TRULICITY 0.75 MG/0.5ML ~~LOC~~ SOAJ: 0.7500 mg | SUBCUTANEOUS | 0 refills | Status: DC

## 2023-12-24 NOTE — Progress Notes (Signed)
Established patient visit  Patient: Alexis Patel   DOB: 02/14/1966   58 y.o. Female  MRN: [id number] Visit Date: 12/24/2023  Today's healthcare provider: Debera Lat, PA-C   Chief Complaint  Patient presents with   Diabetes   Subjective       Discussed the use of AI scribe software for clinical note transcription with the patient, who gave verbal consent to proceed.  History of Present Illness   The patient, with a recent diagnosis of Hashimoto's disease and diabetes, presents for a follow-up visit. She was diagnosed with Hashimoto's a year ago and has been on hormone replacement therapy since then. She reports no previous prescription of levothyroxine. The patient was recently diagnosed with diabetes and has been on metformin and glipizide for about a week. She reports a significant decrease in her blood glucose levels since starting the medication. She has also been monitoring her blood glucose levels at home. The patient has been tolerating the medications well with no reported side effects. She has been making dietary changes and exercising regularly to manage her diabetes. The patient also reports a history of a swollen thyroid noted during an evaluation for ear problems about seven to eight years ago. She has not had any recent imaging of the thyroid.      The 10-year ASCVD risk score (Arnett DK, et al., 2019) is: 3.3%      12/24/2023    8:13 AM 12/16/2023   10:25 AM  Depression screen PHQ 2/9  Decreased Interest 0 0  Down, Depressed, Hopeless 0 0  PHQ - 2 Score 0 0  Altered sleeping 0 1  Tired, decreased energy 0 0  Change in appetite 0 0  Feeling bad or failure about yourself  0 0  Trouble concentrating 0 0  Moving slowly or fidgety/restless 0 0  Suicidal thoughts 0 0  PHQ-9 Score 0 1      12/16/2023   10:27 AM  GAD 7 : Generalized Anxiety Score  Nervous, Anxious, on Edge 0  Control/stop worrying 0  Worry too much - different things 0  Trouble relaxing 0   Restless 0  Easily annoyed or irritable 0  Afraid - awful might happen 0  Total GAD 7 Score 0    Medications: Outpatient Medications Prior to Visit  Medication Sig   B Complex Vitamins (B COMPLEX PO) Take by mouth.   Cholecalciferol (VITAMIN D-3 PO) Take by mouth.   estradiol (ESTRACE VAGINAL) 0.1 MG/GM vaginal cream Place 1 g vaginally 3 (three) times a week.   estradiol (VIVELLE-DOT) 0.05 MG/24HR patch Place 1 patch (0.05 mg total) onto the skin 2 (two) times a week.   fluticasone (FLONASE) 50 MCG/ACT nasal spray Place 1 spray into both nostrils daily.   glipiZIDE (GLUCOTROL) 5 MG tablet Take 1 tablet (5 mg total) by mouth 2 (two) times daily before a meal.   liothyronine (CYTOMEL) 5 MCG tablet Take 1 tablet (5 mcg total) by mouth daily.   Magnesium 125 MG CAPS Take by mouth.   metFORMIN (GLUCOPHAGE) 500 MG tablet Take 1 tablet (500 mg total) by mouth 2 (two) times daily with a meal.   Multiple Vitamin (MULTIVITAMIN PO) Take by mouth.   nystatin-triamcinolone ointment (MYCOLOG) Apply 1 Application topically 2 (two) times daily.   progesterone (PROMETRIUM) 100 MG capsule Take 1 capsule (100 mg total) by mouth at bedtime.   Semaglutide,0.25 or 0.5MG /DOS, (OZEMPIC, 0.25 OR 0.5 MG/DOSE,) 2 MG/3ML SOPN Initial, 0.25 mg subQ once weekly for 4  weeks, then increase to 0.5 mg once weekly   TURMERIC PO Take by mouth.   No facility-administered medications prior to visit.    Review of Systems All negative Except see HPI       Objective    BP 128/81   Pulse 80   Ht 5\' 3"  (1.6 m)   Wt 180 lb (81.6 kg)   LMP 11/28/2018   BMI 31.89 kg/m     Physical Exam Vitals reviewed.  Constitutional:      General: She is not in acute distress.    Appearance: Normal appearance. She is well-developed. She is not diaphoretic.  HENT:     Head: Normocephalic and atraumatic.  Eyes:     General: No scleral icterus.    Conjunctiva/sclera: Conjunctivae normal.  Neck:     Thyroid: No  thyromegaly.  Cardiovascular:     Rate and Rhythm: Normal rate and regular rhythm.     Pulses: Normal pulses.     Heart sounds: Normal heart sounds. No murmur heard. Pulmonary:     Effort: Pulmonary effort is normal. No respiratory distress.     Breath sounds: Normal breath sounds. No wheezing, rhonchi or rales.  Musculoskeletal:     Cervical back: Neck supple.     Right lower leg: No edema.     Left lower leg: No edema.  Lymphadenopathy:     Cervical: No cervical adenopathy.  Skin:    General: Skin is warm and dry.     Findings: No rash.  Neurological:     Mental Status: She is alert and oriented to person, place, and time. Mental status is at baseline.  Psychiatric:        Mood and Affect: Mood normal.        Behavior: Behavior normal.      Results for orders placed or performed in visit on 12/24/23  C-peptide  Result Value Ref Range   C-Peptide 2.4 1.1 - 4.4 ng/mL        Assessment and Plan    Type 2 Diabetes Mellitus Newly diagnosed with a high A1c. Currently on Metformin 500mg  twice daily and Glipizide 5mg  twice daily. Recent home glucose reading of 216. -Increase Metformin to 1000mg  twice daily over the next week if tolerated. -Increase Glipizide to 10mg  twice daily over the next week if tolerated. -Start Trulicity 2mg  weekly. -Check blood glucose fasting and before each meal. -Continue diet and exercise regimen.  Hashimoto's Thyroiditis On hormone replacement therapy. No recent imaging of the thyroid. -Order thyroid ultrasound. -Continue current hormone replacement therapy. Will revisit a need for a differ med and referral to endocrinology  Hyperlipidemia Elevated cholesterol levels noted. -Consider starting statin therapy after managing current diabetes crisis.  Follow-up in 3 weeks to assess response to increased medication dosages and new medication (Trulicity).      Orders Placed This Encounter  Procedures   US THYROID    Standing Status:   Future     Expiration Date:   12/23/2024    Reason for Exam (SYMPTOM  OR DIAGNOSIS REQUIRED):   goiter    Preferred imaging location?:   [facility]   C-peptide    Return in about 3 weeks (around 01/14/2024) for chronic disease f/u.   The patient was advised to call back or seek an in-person evaluation if the symptoms worsen or if the condition fails to improve as anticipated.  I discussed the assessment and treatment plan with the patient. The patient was provided an opportunity to  ask questions and all were answered. The patient agreed with the plan and demonstrated an understanding of the instructions.  I, Debera Lat, PA-C have reviewed all documentation for this visit. The documentation on 12/24/2023  for the exam, diagnosis, procedures, and orders are all accurate and complete.  Debera Lat, [facility], MMS [facility] [phone] (phone) [phone] (fax)  [facility]

## 2023-12-25 LAB — C-PEPTIDE: C-Peptide: 2.4 ng/mL (ref 1.1–4.4)

## 2023-12-26 ENCOUNTER — Encounter: Payer: Self-pay | Admitting: Physician Assistant

## 2023-12-28 ENCOUNTER — Ambulatory Visit
Admission: RE | Admit: 2023-12-28 | Discharge: 2023-12-28 | Disposition: A | Discharge status: Home / Self Care | Source: Ambulatory Visit

## 2023-12-28 DIAGNOSIS — Z1231 Encounter for screening mammogram for malignant neoplasm of breast: Secondary | ICD-10-CM

## 2023-12-30 ENCOUNTER — Encounter: Payer: Self-pay | Admitting: Physician Assistant

## 2024-01-05 ENCOUNTER — Encounter: Payer: Self-pay | Admitting: Physician Assistant

## 2024-01-05 NOTE — Telephone Encounter (Signed)
 Pt called back stating she is unable to get med because dosage was changed. Please advise

## 2024-01-06 LAB — COLOGUARD: COLOGUARD: NEGATIVE

## 2024-01-06 MED ORDER — METFORMIN HCL 500 MG PO TABS: 1000.0000 mg | ORAL_TABLET | Freq: Two times a day (BID) | ORAL | 3 refills | Status: DC

## 2024-01-06 NOTE — Telephone Encounter (Signed)
 Duplicate encounter. Will continue in original encounter.

## 2024-01-07 ENCOUNTER — Encounter: Payer: Self-pay | Admitting: Radiology

## 2024-01-07 ENCOUNTER — Ambulatory Visit (INDEPENDENT_AMBULATORY_CARE_PROVIDER_SITE_OTHER): Admitting: Radiology

## 2024-01-07 VITALS — BP 136/88 | HR 91 | Ht 62.5 in | Wt 176.4 lb

## 2024-01-07 DIAGNOSIS — Z79890 Hormone replacement therapy (postmenopausal): Secondary | ICD-10-CM | POA: Diagnosis not present

## 2024-01-07 DIAGNOSIS — Z1331 Encounter for screening for depression: Secondary | ICD-10-CM | POA: Diagnosis not present

## 2024-01-07 DIAGNOSIS — Z01419 Encounter for gynecological examination (general) (routine) without abnormal findings: Secondary | ICD-10-CM

## 2024-01-07 MED ORDER — ESTRADIOL 0.05 MG/24HR TD PTTW: 1.0000 | MEDICATED_PATCH | TRANSDERMAL | 4 refills | Status: DC

## 2024-01-07 MED ORDER — IMVEXXY MAINTENANCE PACK 10 MCG VA INST: 1.0000 | VAGINAL_INSERT | VAGINAL | 4 refills | Status: DC

## 2024-01-07 MED ORDER — PROGESTERONE MICRONIZED 100 MG PO CAPS: 100.0000 mg | ORAL_CAPSULE | Freq: Every evening | ORAL | 4 refills | Status: AC

## 2024-01-07 NOTE — Progress Notes (Signed)
 Alexis Patel May 15, 1966 [id number]   History: Postmenopausal 58 y.o. presents for annual exam. No gyn concerns. Doing well on HRT, needs refills. Exercising more frequently.    Gynecologic History Postmenopausal Last Pap: 2/24. Results were: normal Last mammogram: 12/28/23. Results were: normal Last colonoscopy: cologuard pending   Obstetric History OB History  Gravida Para Term Preterm AB Living  2 2    2   SAB IAB Ectopic Multiple Live Births          # Outcome Date GA Lbr Len/2nd Weight Sex Type Anes PTL Lv  2 Para           1 Para                01/07/2024    9:02 AM 12/24/2023    8:13 AM 12/16/2023   10:25 AM  Depression screen PHQ 2/9  Decreased Interest 0 0 0  Down, Depressed, Hopeless 0 0 0  PHQ - 2 Score 0 0 0  Altered sleeping  0 1  Tired, decreased energy  0 0  Change in appetite  0 0  Feeling bad or failure about yourself   0 0  Trouble concentrating  0 0  Moving slowly or fidgety/restless  0 0  Suicidal thoughts  0 0  PHQ-9 Score  0 1     The following portions of the patient's history were reviewed and updated as appropriate: allergies, current medications, past family history, past medical history, past social history, past surgical history, and problem list.  Review of Systems Pertinent items noted in HPI and remainder of comprehensive ROS otherwise negative.  Past medical history, past surgical history, family history and social history were all reviewed and documented in the EPIC chart.  Exam:  Vitals:   01/07/24 0901  BP: 136/88  Pulse: 91  SpO2: 99%  Weight: 176 lb 6.4 oz (80 kg)  Height: 5' 2.5" (1.588 m)   Body mass index is 31.75 kg/m.  General appearance:  Normal Thyroid:  Symmetrical, normal in size, without palpable masses or nodularity. Respiratory  Auscultation:  Clear without wheezing or rhonchi Cardiovascular  Auscultation:  Regular rate, without rubs, murmurs or gallops  Edema/varicosities:  Not grossly  evident Abdominal  Soft,nontender, without masses, guarding or rebound.  Liver/spleen:  No organomegaly noted  Hernia:  None appreciated  Skin  Inspection:  Grossly normal Breasts: Examined lying and sitting.   Right: Without masses, retractions, nipple discharge or axillary adenopathy.   Left: Without masses, retractions, nipple discharge or axillary adenopathy. Genitourinary   Inguinal/mons:  Normal without inguinal adenopathy  External genitalia:  Normal appearing vulva with no masses, tenderness, or lesions  BUS/Urethra/Skene's glands:  Normal  Vagina:  Normal appearing with normal color and discharge, no lesions. Atrophy: mild   Cervix:  Normal appearing without discharge or lesions  Uterus:  Normal in size, shape and contour.  Midline and mobile, nontender  Adnexa/parametria:     Rt: Normal in size, without masses or tenderness.   Lt: Normal in size, without masses or tenderness.  Anus and perineum: Normal    Raynelle Fanning, CMA present for exam  Assessment/Plan:   1. Well woman exam with routine gynecological exam (Primary) Pap 2027  2. Hormone replacement therapy (HRT)  - progesterone (PROMETRIUM) 100 MG capsule; Take 1 capsule (100 mg total) by mouth at bedtime.  Dispense: 90 capsule; Refill: 4 - estradiol (VIVELLE-DOT) 0.05 MG/24HR patch; Place 1 patch (0.05 mg total) onto the skin 2 (two) times a  week.  Dispense: 24 patch; Refill: 4 - Estradiol (IMVEXXY MAINTENANCE PACK) 10 MCG INST; Place 1 tablet vaginally 2 (two) times a week.  Dispense: 24 each; Refill: 4   Discussed SBE, colonoscopy and DEXA screening as directed. Recommend of exercise weekly, including weight bearing exercise. Encouraged the use of seatbelts and sunscreen.  Return in 1 year for annual or sooner prn.  Arlie Solomons B WHNP-BC, 9:22 AM 01/07/2024

## 2024-01-07 NOTE — Patient Instructions (Signed)
 Preventive Care 16-60 Years Old, Female  Preventive care refers to lifestyle choices and visits with your health care provider that can promote health and wellness. Preventive care visits are also called wellness exams.  What can I expect for my preventive care visit?  Counseling  Your health care provider may ask you questions about your:  Medical history, including:  Past medical problems.  Family medical history.  Pregnancy history.  Current health, including:  Menstrual cycle.  Method of birth control.  Emotional well-being.  Home life and relationship well-being.  Sexual activity and sexual health.  Lifestyle, including:  Alcohol, nicotine or tobacco, and drug use.  Access to firearms.  Diet, exercise, and sleep habits.  Work and work Astronomer.  Sunscreen use.  Safety issues such as seatbelt and bike helmet use.  Physical exam  Your health care provider will check your:  Height and weight. These may be used to calculate your BMI (body mass index). BMI is a measurement that tells if you are at a healthy weight.  Waist circumference. This measures the distance around your waistline. This measurement also tells if you are at a healthy weight and may help predict your risk of certain diseases, such as type 2 diabetes and high blood pressure.  Heart rate and blood pressure.  Body temperature.  Skin for abnormal spots.  What immunizations do I need?    Vaccines are usually given at various ages, according to a schedule. Your health care provider will recommend vaccines for you based on your age, medical history, and lifestyle or other factors, such as travel or where you work.  What tests do I need?  Screening  Your health care provider may recommend screening tests for certain conditions. This may include:  Lipid and cholesterol levels.  Diabetes screening. This is done by checking your blood sugar (glucose) after you have not eaten for a while (fasting).  Pelvic exam and Pap test.  Hepatitis B test.  Hepatitis C  test.  HIV (human immunodeficiency virus) test.  STI (sexually transmitted infection) testing, if you are at risk.  Lung cancer screening.  Colorectal cancer screening.  Mammogram. Talk with your health care provider about when you should start having regular mammograms. This may depend on whether you have a family history of breast cancer.  BRCA-related cancer screening. This may be done if you have a family history of breast, ovarian, tubal, or peritoneal cancers.  Bone density scan. This is done to screen for osteoporosis.  Talk with your health care provider about your test results, treatment options, and if necessary, the need for more tests.  Follow these instructions at home:  Eating and drinking    Eat a diet that includes fresh fruits and vegetables, whole grains, lean protein, and low-fat dairy products.  Take vitamin and mineral supplements as recommended by your health care provider.  Do not drink alcohol if:  Your health care provider tells you not to drink.  You are pregnant, may be pregnant, or are planning to become pregnant.  If you drink alcohol:  Limit how much you have to 0-1 drink a day.  Know how much alcohol is in your drink. In the [place] one drink equals one 12 oz bottle of beer (355 mL), one 5 oz glass of wine (148 mL), or one 1 oz glass of hard liquor (44 mL).  Lifestyle  Brush your teeth every morning and night with fluoride toothpaste. Floss one time each day.  Exercise for at least  30 minutes 5 or more days each week.  Do not use any products that contain nicotine or tobacco. These products include cigarettes, chewing tobacco, and vaping devices, such as e-cigarettes. If you need help quitting, ask your health care provider.  Do not use drugs.  If you are sexually active, practice safe sex. Use a condom or other form of protection to prevent STIs.  If you do not wish to become pregnant, use a form of birth control. If you plan to become pregnant, see your health care provider for a  prepregnancy visit.  Take aspirin only as told by your health care provider. Make sure that you understand how much to take and what form to take. Work with your health care provider to find out whether it is safe and beneficial for you to take aspirin daily.  Find healthy ways to manage stress, such as:  Meditation, yoga, or listening to music.  Journaling.  Talking to a trusted person.  Spending time with friends and family.  Minimize exposure to UV radiation to reduce your risk of skin cancer.  Safety  Always wear your seat belt while driving or riding in a vehicle.  Do not drive:  If you have been drinking alcohol. Do not ride with someone who has been drinking.  When you are tired or distracted.  While texting.  If you have been using any mind-altering substances or drugs.  Wear a helmet and other protective equipment during sports activities.  If you have firearms in your house, make sure you follow all gun safety procedures.  Seek help if you have been physically or sexually abused.  What's next?  Visit your health care provider once a year for an annual wellness visit.  Ask your health care provider how often you should have your eyes and teeth checked.  Stay up to date on all vaccines.  This information is not intended to replace advice given to you by your health care provider. Make sure you discuss any questions you have with your health care provider.  Document Revised: 04/23/2021 Document Reviewed: 04/23/2021  Elsevier Patient Education  2024 [employer].

## 2024-01-10 ENCOUNTER — Ambulatory Visit

## 2024-01-10 ENCOUNTER — Other Ambulatory Visit: Payer: Self-pay | Admitting: Radiology

## 2024-01-10 DIAGNOSIS — Z79890 Hormone replacement therapy (postmenopausal): Secondary | ICD-10-CM

## 2024-01-10 NOTE — Telephone Encounter (Signed)
 Medication was refilled 01/07/24. Rx refused due to duplicate.

## 2024-01-13 ENCOUNTER — Other Ambulatory Visit: Payer: Self-pay | Admitting: Physician Assistant

## 2024-01-13 NOTE — Telephone Encounter (Signed)
 Requested medication (s) are due for refill today:   Yes  Requested medication (s) are on the active medication list:   Yes  Future visit scheduled:   Yes 3/20   Last ordered: 12/16/2023 #30, 0 refills  Unable to refill because OV due.   Has upcoming appt.    Already had 30 day courtesy supply given    Provider to review   Requested Prescriptions  Pending Prescriptions Disp Refills   liothyronine (CYTOMEL) 5 MCG tablet [Pharmacy Med Name: LIOTHYRONINE TABLETS] 30 tablet 0    Sig: TAKE 1 TABLET(5 MCG) BY MOUTH DAILY     Endocrinology:  Hypothyroid Agents Failed - 01/13/2024  3:38 PM      Failed - Valid encounter within last 12 months    Recent Outpatient Visits   None     Future Appointments             In 2 weeks Ostwalt, Janna, PA-C [facility]            Passed - TSH in normal range and within 360 days    TSH  Date Value Ref Range Status  12/16/2023 0.947 0.450 - 4.500 uIU/mL Final

## 2024-01-14 ENCOUNTER — Encounter: Admitting: Physician Assistant

## 2024-01-20 ENCOUNTER — Other Ambulatory Visit: Payer: Self-pay | Admitting: Physician Assistant

## 2024-01-20 DIAGNOSIS — E119 Type 2 diabetes mellitus without complications: Secondary | ICD-10-CM

## 2024-01-20 NOTE — Telephone Encounter (Signed)
 Requested Prescriptions  Pending Prescriptions Disp Refills   Dulaglutide (TRULICITY) 0.75 MG/0.5ML SOAJ [Pharmacy Med Name: TRULICITY 0.75MG /0.5ML INJ (4 PENS)] 2 mL 2    Sig: ADMINISTER 0.75 MG UNDER THE SKIN 1 TIME A WEEK     Endocrinology:  Diabetes - GLP-1 Receptor Agonists Failed - 01/20/2024  2:04 PM      Failed - HBA1C is between 0 and 7.9 and within 180 days    Hgb A1c MFr Bld  Date Value Ref Range Status  12/16/2023 14.1 (H) 4.8 - 5.6 % Final    Comment:             Prediabetes: 5.7 - 6.4          Diabetes: >6.4          Glycemic control for adults with diabetes: <7.0          Failed - Valid encounter within last 6 months    Recent Outpatient Visits   None     Future Appointments             In 1 week Ostwalt, Edmon Crape, PA-C [facility], [facility]

## 2024-01-23 NOTE — Progress Notes (Unsigned)
 Complete physical exam  Patient: Alexis Patel   DOB: 1966/07/08   58 y.o. Female  MRN: [id number] Visit Date: 01/27/2024  Today's healthcare provider: Debera Lat, PA-C   No chief complaint on file.  Subjective    Alexis Patel is a 58 y.o. female who presents today for a complete physical exam.  She reports consuming a {diet types:17450} diet. {Exercise:19826} She generally feels {well/fairly well/poorly:18703}. She reports sleeping {well/fairly well/poorly:18703}. She {does/does not:200015} have additional problems to discuss today.  HPI  *** Discussed the use of AI scribe software for clinical note transcription with the patient, who gave verbal consent to proceed.  History of Present Illness            Last depression screening scores    01/07/2024    9:02 AM 12/24/2023    8:13 AM 12/16/2023   10:25 AM  PHQ 2/9 Scores  PHQ - 2 Score 0 0 0  PHQ- 9 Score  0 1   Last fall risk screening    12/24/2023    8:12 AM  Fall Risk   Falls in the past year? 0   Last Audit-C alcohol use screening    12/16/2023    9:02 AM  Alcohol Use Disorder Test (AUDIT)  1. How often do you have a drink containing alcohol? 0  3. How often do you have six or more drinks on one occasion? 0   A score of 3 or more in women, and 4 or more in men indicates increased risk for alcohol abuse, EXCEPT if all of the points are from question 1   Past Medical History:  Diagnosis Date  . Blood in stool   . Diabetes mellitus without complication (HCC)   . Hemorrhoids   . PONV (postoperative nausea and vomiting)   . Thyroid disease    Past Surgical History:  Procedure Laterality Date  . CHOLECYSTECTOMY N/A 10/28/2017   Procedure: LAPAROSCOPIC CHOLECYSTECTOMY;  Surgeon: Kinsinger, De Blanch, MD;  Location: [facility];  Service: General;  Laterality: N/A;  . orthodontic     teeth removed to make room for braces ; procedure done in tten years    Social History   Socioeconomic History  . Marital  status: Married    Spouse name: Not on file  . Number of children: Not on file  . Years of education: Not on file  . Highest education level: 12th grade  Occupational History  . Not on file  Tobacco Use  . Smoking status: Never    Passive exposure: Past  . Smokeless tobacco: Never  Vaping Use  . Vaping status: Never Used  Substance and Sexual Activity  . Alcohol use: No  . Drug use: No  . Sexual activity: Yes    Partners: Male    Birth control/protection: Post-menopausal    Comment: menarche 58yo, sexual debut 58yo  Other Topics Concern  . Not on file  Social History Narrative   Married.   2 children. 6 grandchildren.   Works as a Futures trader.   Enjoys making handmade soaps, spending time with family.    Social Drivers of Corporate investment banker Strain: Low Risk  (12/16/2023)   Overall Financial Resource Strain (CARDIA)   . Difficulty of Paying Living Expenses: Not hard at all  Food Insecurity: No Food Insecurity (12/16/2023)   Hunger Vital Sign   . Worried About Programme researcher, broadcasting/film/video in the Last Year: Never true   . Ran Out of Food  in the Last Year: Never true  Transportation Needs: No Transportation Needs (12/16/2023)   PRAPARE - Transportation   . Lack of Transportation (Medical): No   . Lack of Transportation (Non-Medical): No  Physical Activity: Insufficiently Active (12/16/2023)   Exercise Vital Sign   . Days of Exercise per Week: 4 days   . Minutes of Exercise per Session: 30 min  Stress: No Stress Concern Present (12/16/2023)   [employer] of Occupational Health - Occupational Stress Questionnaire   . Feeling of Stress : Only a little  Social Connections: Socially Integrated (12/16/2023)   Social Connection and Isolation Panel [NHANES]   . Frequency of Communication with Friends and Family: More than three times a week   . Frequency of Social Gatherings with Friends and Family: Once a week   . Attends Religious Services: More than 4 times per year   . Active  Member of Clubs or Organizations: Yes   . Attends Banker Meetings: More than 4 times per year   . Marital Status: Married  Catering manager Violence: Not on file   Family Status  Relation Name Status  . Mother  Deceased  . Father  Deceased  . Sister  (Not Specified)  . Sister  (Not Specified)  . Emelda Brothers  Deceased  . PGM  (Not Specified)  . PGF  (Not Specified)  . Neg Hx  (Not Specified)  No partnership data on file   Family History  Problem Relation Age of Onset  . Breast cancer Mother 34       mets d. 69  . Arthritis Father   . Hyperlipidemia Father   . Stroke Father   . Hypertension Father   . Arthritis Sister   . Hyperlipidemia Sister   . Diabetes Sister   . Breast cancer Paternal Aunt        dx 30s-40s; mets in 72s  . Stomach cancer Paternal Grandmother        dx 69s; or other primary w/ mets?  . Stomach cancer Paternal Grandfather        dx <60? or other primary w/ mets?  . Thyroid disease Neg Hx        neice   Allergies  Allergen Reactions  . Codeine Nausea And Vomiting    Upset stomach    Patient Care Team: Debera Lat, PA-C as PCP - General (Physician Assistant) Tanda Rockers, NP as Nurse Practitioner (Radiology)   Medications: Outpatient Medications Prior to Visit  Medication Sig  . B Complex Vitamins (B COMPLEX PO) Take by mouth.  . Cholecalciferol (VITAMIN D-3 PO) Take by mouth.  . Dulaglutide (TRULICITY) 0.75 MG/0.5ML SOAJ ADMINISTER 0.75 MG UNDER THE SKIN 1 TIME A WEEK  . Estradiol (IMVEXXY MAINTENANCE PACK) 10 MCG INST Place 1 tablet vaginally 2 (two) times a week.  . estradiol (VIVELLE-DOT) 0.05 MG/24HR patch Place 1 patch (0.05 mg total) onto the skin 2 (two) times a week.  . fluticasone (FLONASE) 50 MCG/ACT nasal spray Place 1 spray into both nostrils daily.  Marland Kitchen glipiZIDE (GLUCOTROL) 5 MG tablet Take 1 tablet (5 mg total) by mouth 2 (two) times daily before a meal. (Patient not taking: Reported on 01/07/2024)  . glipiZIDE  (GLUCOTROL) 5 MG tablet Take 1 tablet (5 mg total) by mouth 2 (two) times daily before a meal.  . liothyronine (CYTOMEL) 5 MCG tablet TAKE 1 TABLET(5 MCG) BY MOUTH DAILY  . Magnesium 125 MG CAPS Take by mouth.  . metFORMIN (GLUCOPHAGE)  500 MG tablet Take 2 tablets (1,000 mg total) by mouth 2 (two) times daily with a meal.  . Multiple Vitamin (MULTIVITAMIN PO) Take by mouth.  . nystatin-triamcinolone ointment (MYCOLOG) Apply 1 Application topically 2 (two) times daily.  . progesterone (PROMETRIUM) 100 MG capsule Take 1 capsule (100 mg total) by mouth at bedtime.   No facility-administered medications prior to visit.    Review of Systems  All other systems reviewed and are negative. Except see HPI  {Insert previous labs (optional):23779} {See past labs  Heme  Chem  Endocrine  Serology  Results Review (optional):1}  Objective    LMP 11/28/2018  {Insert last BP/Wt (optional):23777}{See vitals history (optional):1}    Physical Exam Vitals reviewed.  Constitutional:      General: She is not in acute distress.    Appearance: Normal appearance. She is well-developed. She is not ill-appearing, toxic-appearing or diaphoretic.  HENT:     Head: Normocephalic and atraumatic.     Right Ear: Tympanic membrane, ear canal and external ear normal.     Left Ear: Tympanic membrane, ear canal and external ear normal.     Nose: Nose normal. No congestion or rhinorrhea.     Mouth/Throat:     Mouth: Mucous membranes are moist.     Pharynx: Oropharynx is clear. No oropharyngeal exudate.  Eyes:     General: No scleral icterus.       Right eye: No discharge.        Left eye: No discharge.     Conjunctiva/sclera: Conjunctivae normal.     Pupils: Pupils are equal, round, and reactive to light.  Neck:     Thyroid: No thyromegaly.     Vascular: No carotid bruit.  Cardiovascular:     Rate and Rhythm: Normal rate and regular rhythm.     Pulses: Normal pulses.     Heart sounds: Normal heart  sounds. No murmur heard.    No friction rub. No gallop.  Pulmonary:     Effort: Pulmonary effort is normal. No respiratory distress.     Breath sounds: Normal breath sounds. No wheezing or rales.  Abdominal:     General: Abdomen is flat. Bowel sounds are normal. There is no distension.     Palpations: Abdomen is soft. There is no mass.     Tenderness: There is no abdominal tenderness. There is no right CVA tenderness, left CVA tenderness, guarding or rebound.     Hernia: No hernia is present.  Musculoskeletal:        General: No swelling, tenderness, deformity or signs of injury. Normal range of motion.     Cervical back: Normal range of motion and neck supple. No rigidity or tenderness.     Right lower leg: No edema.     Left lower leg: No edema.  Lymphadenopathy:     Cervical: No cervical adenopathy.  Skin:    General: Skin is warm and dry.     Coloration: Skin is not jaundiced or pale.     Findings: No bruising, erythema, lesion or rash.  Neurological:     Mental Status: She is alert and oriented to person, place, and time. Mental status is at baseline.     Gait: Gait normal.  Psychiatric:        Mood and Affect: Mood normal.        Behavior: Behavior normal.        Thought Content: Thought content normal.        Judgment:  Judgment normal.     No results found for any visits on 01/27/24.  Assessment & Plan    Routine Health Maintenance and Physical Exam  Exercise Activities and Dietary recommendations  Goals   None     Immunization History  Administered Date(s) Administered  . PFIZER(Purple Top)SARS-COV-2 Vaccination 02/04/2020, 02/27/2020  . Tdap 12/16/2023    Health Maintenance  Topic Date Due  . HIV Screening  Never done  . Yearly kidney health urinalysis for diabetes  Never done  . Hepatitis C Screening  Never done  . Colon Cancer Screening  Never done  . Zoster (Shingles) Vaccine (1 of 2) Never done  . COVID-19 Vaccine (3 - 2024-25 season) 07/11/2023  .  Flu Shot  02/07/2024*  . Yearly kidney function blood test for diabetes  12/15/2024  . Mammogram  12/27/2025  . Pap with HPV screening  01/05/2026  . DTaP/Tdap/Td vaccine (2 - Td or Tdap) 12/15/2033  . HPV Vaccine  Aged Out  *Topic was postponed. The date shown is not the original due date.    Discussed health benefits of physical activity, and encouraged her to engage in regular exercise appropriate for her age and condition.  Assessment and Plan              ***  No follow-ups on file.    The patient was advised to call back or seek an in-person evaluation if the symptoms worsen or if the condition fails to improve as anticipated.  I discussed the assessment and treatment plan with the patient. The patient was provided an opportunity to ask questions and all were answered. The patient agreed with the plan and demonstrated an understanding of the instructions.  I, Debera Lat, PA-C have reviewed all documentation for this visit. The documentation on 01/27/2024  for the exam, diagnosis, procedures, and orders are all accurate and complete.  Debera Lat, [facility], MMS [facility] [phone] (phone) [phone] (fax)  [facility]

## 2024-01-27 ENCOUNTER — Encounter: Payer: Self-pay | Admitting: Physician Assistant

## 2024-01-27 ENCOUNTER — Ambulatory Visit: Admitting: Physician Assistant

## 2024-01-27 VITALS — BP 124/73 | HR 94 | Temp 98.1°F | Resp 16 | Ht 63.0 in | Wt 196.6 lb

## 2024-01-27 DIAGNOSIS — E7849 Other hyperlipidemia: Secondary | ICD-10-CM

## 2024-01-27 DIAGNOSIS — E063 Autoimmune thyroiditis: Secondary | ICD-10-CM

## 2024-01-27 DIAGNOSIS — Z0001 Encounter for general adult medical examination with abnormal findings: Secondary | ICD-10-CM | POA: Diagnosis not present

## 2024-01-27 DIAGNOSIS — E038 Other specified hypothyroidism: Secondary | ICD-10-CM

## 2024-01-27 DIAGNOSIS — E119 Type 2 diabetes mellitus without complications: Secondary | ICD-10-CM | POA: Insufficient documentation

## 2024-01-27 DIAGNOSIS — E1169 Type 2 diabetes mellitus with other specified complication: Secondary | ICD-10-CM | POA: Diagnosis not present

## 2024-01-27 DIAGNOSIS — I10 Essential (primary) hypertension: Secondary | ICD-10-CM | POA: Diagnosis not present

## 2024-01-27 DIAGNOSIS — Z0000 Encounter for general adult medical examination without abnormal findings: Secondary | ICD-10-CM | POA: Insufficient documentation

## 2024-01-27 DIAGNOSIS — Z780 Asymptomatic menopausal state: Secondary | ICD-10-CM

## 2024-01-27 DIAGNOSIS — K5909 Other constipation: Secondary | ICD-10-CM

## 2024-01-27 MED ORDER — GLIPIZIDE 10 MG PO TABS: 10.0000 mg | ORAL_TABLET | Freq: Two times a day (BID) | ORAL | 3 refills | Status: DC

## 2024-01-28 LAB — BASIC METABOLIC PANEL
BUN/Creatinine Ratio: 15 (ref 9–23)
BUN: 10 mg/dL (ref 6–24)
CO2: 21 mmol/L (ref 20–29)
Calcium: 10.4 mg/dL — ABNORMAL HIGH (ref 8.7–10.2)
Chloride: 97 mmol/L (ref 96–106)
Creatinine, Ser: 0.65 mg/dL (ref 0.57–1.00)
Glucose: 114 mg/dL — ABNORMAL HIGH (ref 70–99)
Potassium: 4.3 mmol/L (ref 3.5–5.2)
Sodium: 137 mmol/L (ref 134–144)
eGFR: 101 mL/min/{1.73_m2} (ref >59)

## 2024-01-28 LAB — LIPID PANEL
Chol/HDL Ratio: 3.7 ratio (ref 0.0–4.4)
Cholesterol, Total: 206 mg/dL — ABNORMAL HIGH (ref 100–199)
HDL: 56 mg/dL (ref >39)
LDL Chol Calc (NIH): 123 mg/dL — ABNORMAL HIGH (ref 0–99)
Triglycerides: 156 mg/dL — ABNORMAL HIGH (ref 0–149)
VLDL Cholesterol Cal: 27 mg/dL (ref 5–40)

## 2024-01-31 ENCOUNTER — Encounter: Payer: Self-pay | Admitting: Physician Assistant

## 2024-01-31 ENCOUNTER — Other Ambulatory Visit: Payer: Self-pay | Admitting: Physician Assistant

## 2024-01-31 DIAGNOSIS — E1169 Type 2 diabetes mellitus with other specified complication: Secondary | ICD-10-CM

## 2024-01-31 MED ORDER — ROSUVASTATIN CALCIUM 5 MG PO TABS
5.0000 mg | ORAL_TABLET | Freq: Every day | ORAL | 1 refills | Status: DC
Start: 2024-01-31 — End: 2024-07-31

## 2024-02-02 NOTE — Telephone Encounter (Signed)
 Call placed to patient to let her know that she has refills at the [facility] on [address].

## 2024-02-03 ENCOUNTER — Encounter: Payer: Self-pay | Admitting: Physician Assistant

## 2024-02-04 ENCOUNTER — Other Ambulatory Visit: Payer: Self-pay | Admitting: Radiology

## 2024-02-04 DIAGNOSIS — Z79890 Hormone replacement therapy (postmenopausal): Secondary | ICD-10-CM

## 2024-02-04 NOTE — Telephone Encounter (Signed)
 Med refill request: estradiol 0.05 mg patch Last AEX: 01/07/24 Next AEX: none scheduled Last MMG (if hormonal med) 12/28/23 BI-RADS 1 negative Refill denied.  RX 01/07/24 #24 with 4 refills.  Patient should have refills.  Pharmacy notified. Sent to provider for review.

## 2024-02-04 NOTE — Telephone Encounter (Signed)
 Spoke w/ [place] at [facility], she advised that they did not receive the e-scribe for pt's estradiol patches on 01/10/2024.  [facility] accepted at the time of the call for the estradiol 0.05mg /24hr patches, twice weekly, #24 with 4 refills per e-script by JC, by pharmacist and he reported that they have the patches in stock but not enough today for a 3 month supply but will provide her with one month for now and will provide the rest when received in pharmacy. Pt notified via mychart msg and information routed to [facility] for final review and encounter closed.

## 2024-02-07 ENCOUNTER — Encounter: Payer: Self-pay | Admitting: Physician Assistant

## 2024-02-07 ENCOUNTER — Ambulatory Visit: Admitting: Physician Assistant

## 2024-02-07 VITALS — BP 119/57 | HR 87 | Resp 16 | Ht 63.0 in | Wt 167.9 lb

## 2024-02-07 DIAGNOSIS — Z7984 Long term (current) use of oral hypoglycemic drugs: Secondary | ICD-10-CM

## 2024-02-07 DIAGNOSIS — R1011 Right upper quadrant pain: Secondary | ICD-10-CM

## 2024-02-07 DIAGNOSIS — E119 Type 2 diabetes mellitus without complications: Secondary | ICD-10-CM | POA: Diagnosis not present

## 2024-02-07 NOTE — Progress Notes (Signed)
 Established patient visit  Patient: Alexis Patel   DOB: Nov 29, 1965   58 y.o. Female  MRN: [id number] Visit Date: 02/07/2024  Today's healthcare provider: Debera Lat, PA-C   Chief Complaint  Patient presents with   Dysmenorrhea    Rt side cramping since Friday    Subjective      Discussed the use of AI scribe software for clinical note transcription with the patient, who gave verbal consent to proceed.  History of Present Illness The patient, with a history of diabetes and gallbladder removal, presents with a three-day history of abdominal pain that radiates to the back. The pain is described as cramp-like and is rated as 4 out of 10 in severity. The pain was severe enough to disrupt sleep and was not relieved by Tylenol. The patient reports that bowel movements seem to slightly ease the pain. The patient has been on a low-carb diet for about six weeks and denies any changes in diet, new supplements, or new medications. The patient was previously on Trulicity for diabetes but stopped due to stomach cramping. The patient denies fever, heartburn, and changes in bowel habits.       01/07/2024    9:02 AM 12/24/2023    8:13 AM 12/16/2023   10:25 AM  Depression screen PHQ 2/9  Decreased Interest 0 0 0  Down, Depressed, Hopeless 0 0 0  PHQ - 2 Score 0 0 0  Altered sleeping  0 1  Tired, decreased energy  0 0  Change in appetite  0 0  Feeling bad or failure about yourself   0 0  Trouble concentrating  0 0  Moving slowly or fidgety/restless  0 0  Suicidal thoughts  0 0  PHQ-9 Score  0 1      12/16/2023   10:27 AM  GAD 7 : Generalized Anxiety Score  Nervous, Anxious, on Edge 0  Control/stop worrying 0  Worry too much - different things 0  Trouble relaxing 0  Restless 0  Easily annoyed or irritable 0  Afraid - awful might happen 0  Total GAD 7 Score 0    Medications: Outpatient Medications Prior to Visit  Medication Sig   B Complex Vitamins (B COMPLEX PO) Take by mouth.    Cholecalciferol (VITAMIN D-3 PO) Take by mouth.   Dulaglutide (TRULICITY) 0.75 MG/0.5ML SOAJ ADMINISTER 0.75 MG UNDER THE SKIN 1 TIME A WEEK   Estradiol (IMVEXXY MAINTENANCE PACK) 10 MCG INST Place 1 tablet vaginally 2 (two) times a week.   estradiol (VIVELLE-DOT) 0.05 MG/24HR patch Place 1 patch (0.05 mg total) onto the skin 2 (two) times a week.   fluticasone (FLONASE) 50 MCG/ACT nasal spray Place 1 spray into both nostrils daily.   glipiZIDE (GLUCOTROL) 10 MG tablet Take 1 tablet (10 mg total) by mouth 2 (two) times daily before a meal.   liothyronine (CYTOMEL) 5 MCG tablet TAKE 1 TABLET(5 MCG) BY MOUTH DAILY   Magnesium 125 MG CAPS Take by mouth.   metFORMIN (GLUCOPHAGE) 500 MG tablet Take 2 tablets (1,000 mg total) by mouth 2 (two) times daily with a meal.   Multiple Vitamin (MULTIVITAMIN PO) Take by mouth.   nystatin-triamcinolone ointment (MYCOLOG) Apply 1 Application topically 2 (two) times daily.   progesterone (PROMETRIUM) 100 MG capsule Take 1 capsule (100 mg total) by mouth at bedtime.   rosuvastatin (CRESTOR) 5 MG tablet Take 1 tablet (5 mg total) by mouth daily.   No facility-administered medications prior to visit.    Review of  Systems All negative Except see HPI       Objective    BP (!) 119/57 (BP Location: Left Arm, Patient Position: Sitting)   Pulse 87   Resp 16   Ht 5\' 3"  (1.6 m)   Wt 167 lb 14.4 oz (76.2 kg)   LMP 11/28/2018   SpO2 100%   BMI 29.74 kg/m     Physical Exam Vitals reviewed.  Constitutional:      General: She is not in acute distress.    Appearance: She is well-developed.  HENT:     Head: Normocephalic and atraumatic.  Eyes:     General: No scleral icterus.    Conjunctiva/sclera: Conjunctivae normal.  Cardiovascular:     Rate and Rhythm: Normal rate and regular rhythm.     Heart sounds: Normal heart sounds. No murmur heard. Pulmonary:     Effort: Pulmonary effort is normal. No respiratory distress.     Breath sounds: Normal breath  sounds. No wheezing or rales.  Abdominal:     General: Bowel sounds are normal. There is no distension.     Palpations: Abdomen is soft.     Tenderness: There is abdominal tenderness (RUQ but not during PE) in the suprapubic area. There is no right CVA tenderness, left CVA tenderness, guarding or rebound.  Skin:    General: Skin is warm and dry.     Capillary Refill: Capillary refill takes less than 2 seconds.     Findings: No rash.  Neurological:     Mental Status: She is alert and oriented to person, place, and time.  Psychiatric:        Behavior: Behavior normal.      No results found for any visits on 02/07/24.      Assessment and Plan Assessment & Plan Abdominal Pain Presents with right-sided abdominal pain radiating to the back, described as cramping, beginning on Saturday with some relief post-bowel movement. Differential includes gastrointestinal issues, possible gastroenteritis, and less likely UTI or kidney stones. Urinalysis negative for leukocytes and blood, reducing likelihood of UTI and kidney stones. Pain reminiscent of gallstone pain, despite cholecystectomy. Recently stopped Trulicity due to gastrointestinal side effects, which may contribute to symptoms. Gastrointestinal issues suspected, possibly related to dietary changes or Trulicity. - Order blood work including lipase and amylase to assess pancreatic function. - Order abdominal ultrasound to evaluate for any gastrointestinal abnormalities. - Send urine for culture to rule out UTI. - Monitor symptoms and dietary intake, particularly bowel movements and any potential food triggers.  Type 2 Diabetes Mellitus Type 2 diabetes managed with metformin and glipizide. Previously on Trulicity, discontinued due to gastrointestinal side effects. Concerned about trying other GLP-1 receptor agonists due to fear of similar side effects. A1c was previously 14 %. Discussed that while some patients tolerate Ozempic or Mounjaro well,  others do not, and individual response varies. Agreed to continue current regimen and monitor glucose levels. - Continue current diabetes management with metformin and glipizide. Due to ACR at the follow-up  - Monitor blood glucose levels using the Libre 3 sensor. - Reassess diabetes management plan in May or sooner if symptoms persist.  General Health Maintenance On a low-carb diet as part of diabetes management. Primarily drinking water, with occasional diet soda. - Encourage continued adherence to a low-carb diet. - Ensure adequate hydration with water.    Orders Placed This Encounter  Procedures   Urine Culture   US Abdomen Complete    Standing Status:   Future  Expiration Date:   02/06/2025    Reason for Exam (SYMPTOM  OR DIAGNOSIS REQUIRED):   RUQ pain, x 3 days    Preferred imaging location?:   [facility]   CBC with Differential/Platelet   Comprehensive metabolic panel with GFR   Amylase   Lipase   Urinalysis, microscopic only    No follow-ups on file.   The patient was advised to call back or seek an in-person evaluation if the symptoms worsen or if the condition fails to improve as anticipated.  I discussed the assessment and treatment plan with the patient. The patient was provided an opportunity to ask questions and all were answered. The patient agreed with the plan and demonstrated an understanding of the instructions.  I, Debera Lat, PA-C have reviewed all documentation for this visit. The documentation on 02/07/2024  for the exam, diagnosis, procedures, and orders are all accurate and complete.  Debera Lat, [facility], MMS [facility] [phone] (phone) [phone] (fax)  [facility]

## 2024-02-08 LAB — CBC WITH DIFFERENTIAL/PLATELET
Basophils Absolute: 0.1 10*3/uL (ref 0.0–0.2)
Basos: 1 %
EOS (ABSOLUTE): 0.3 10*3/uL (ref 0.0–0.4)
Eos: 5 %
Hematocrit: 39.8 % (ref 34.0–46.6)
Hemoglobin: 14 g/dL (ref 11.1–15.9)
Immature Grans (Abs): 0 10*3/uL (ref 0.0–0.1)
Immature Granulocytes: 0 %
Lymphocytes Absolute: 2 10*3/uL (ref 0.7–3.1)
Lymphs: 27 %
MCH: 31 pg (ref 26.6–33.0)
MCHC: 35.2 g/dL (ref 31.5–35.7)
MCV: 88 fL (ref 79–97)
Monocytes Absolute: 0.5 10*3/uL (ref 0.1–0.9)
Monocytes: 6 %
Neutrophils Absolute: 4.5 10*3/uL (ref 1.4–7.0)
Neutrophils: 61 %
Platelets: 387 10*3/uL (ref 150–450)
RBC: 4.51 x10E6/uL (ref 3.77–5.28)
RDW: 11.5 % — ABNORMAL LOW (ref 11.7–15.4)
WBC: 7.4 10*3/uL (ref 3.4–10.8)

## 2024-02-08 LAB — COMPREHENSIVE METABOLIC PANEL WITH GFR
ALT: 11 IU/L (ref 0–32)
AST: 11 IU/L (ref 0–40)
Albumin: 4.4 g/dL (ref 3.8–4.9)
Alkaline Phosphatase: 79 IU/L (ref 44–121)
BUN/Creatinine Ratio: 16 (ref 9–23)
BUN: 9 mg/dL (ref 6–24)
Bilirubin Total: 0.5 mg/dL (ref 0.0–1.2)
CO2: 23 mmol/L (ref 20–29)
Calcium: 10.4 mg/dL — ABNORMAL HIGH (ref 8.7–10.2)
Chloride: 97 mmol/L (ref 96–106)
Creatinine, Ser: 0.58 mg/dL (ref 0.57–1.00)
Globulin, Total: 3 g/dL (ref 1.5–4.5)
Glucose: 156 mg/dL — ABNORMAL HIGH (ref 70–99)
Potassium: 4.3 mmol/L (ref 3.5–5.2)
Sodium: 136 mmol/L (ref 134–144)
Total Protein: 7.4 g/dL (ref 6.0–8.5)
eGFR: 104 mL/min/{1.73_m2} (ref >59)

## 2024-02-08 LAB — URINALYSIS, MICROSCOPIC ONLY
Casts: NONE SEEN /LPF
Epithelial Cells (non renal): >10 /HPF — AB (ref 0–10)
RBC, Urine: NONE SEEN /HPF (ref 0–2)

## 2024-02-08 LAB — LIPASE: Lipase: 33 U/L (ref 14–72)

## 2024-02-08 LAB — AMYLASE: Amylase: 29 U/L — ABNORMAL LOW (ref 31–110)

## 2024-02-09 ENCOUNTER — Encounter: Payer: Self-pay | Admitting: Physician Assistant

## 2024-02-09 LAB — URINE CULTURE

## 2024-02-10 ENCOUNTER — Ambulatory Visit
Admission: RE | Admit: 2024-02-10 | Discharge: 2024-02-10 | Disposition: A | Discharge status: Home / Self Care | Source: Ambulatory Visit | Attending: Physician Assistant | Admitting: Physician Assistant

## 2024-02-10 DIAGNOSIS — R1011 Right upper quadrant pain: Secondary | ICD-10-CM | POA: Insufficient documentation

## 2024-02-11 ENCOUNTER — Other Ambulatory Visit: Payer: Self-pay | Admitting: Physician Assistant

## 2024-02-11 ENCOUNTER — Encounter: Payer: Self-pay | Admitting: Physician Assistant

## 2024-02-11 NOTE — Telephone Encounter (Signed)
 Requested Prescriptions  Pending Prescriptions Disp Refills   liothyronine (CYTOMEL) 5 MCG tablet [Pharmacy Med Name: LIOTHYRONINE TABLETS] 30 tablet 0    Sig: TAKE 1 TABLET(5 MCG) BY MOUTH DAILY     Endocrinology:  Hypothyroid Agents Passed - 02/11/2024  3:30 PM      Passed - TSH in normal range and within 360 days    TSH  Date Value Ref Range Status  12/16/2023 0.947 0.450 - 4.500 uIU/mL Final         Passed - Valid encounter within last 12 months    Recent Outpatient Visits           4 days ago RUQ pain   [facility] [place], [place], PA-C   2 weeks ago Diabetes mellitus without complication [facility])   [facility] [place], [place], PA-C   1 month ago Diabetes mellitus without complication [facility])   [place] [facility] [place], [place], PA-C   1 month ago Primary hypertension   [facility] [facility] [place], [place], PA-C       Future Appointments             In 3 weeks [place], Edmon Crape, PA-C [facility], [facility]

## 2024-02-16 ENCOUNTER — Encounter: Payer: Self-pay | Admitting: Physician Assistant

## 2024-02-16 ENCOUNTER — Ambulatory Visit: Admitting: Physician Assistant

## 2024-02-16 VITALS — BP 132/73 | HR 90 | Resp 16 | Ht 63.0 in | Wt 163.3 lb

## 2024-02-16 DIAGNOSIS — Z7984 Long term (current) use of oral hypoglycemic drugs: Secondary | ICD-10-CM

## 2024-02-16 DIAGNOSIS — E1159 Type 2 diabetes mellitus with other circulatory complications: Secondary | ICD-10-CM | POA: Diagnosis not present

## 2024-02-16 DIAGNOSIS — I10 Essential (primary) hypertension: Secondary | ICD-10-CM | POA: Diagnosis not present

## 2024-02-16 DIAGNOSIS — R109 Unspecified abdominal pain: Secondary | ICD-10-CM | POA: Diagnosis not present

## 2024-02-16 DIAGNOSIS — E119 Type 2 diabetes mellitus without complications: Secondary | ICD-10-CM

## 2024-02-16 DIAGNOSIS — E162 Hypoglycemia, unspecified: Secondary | ICD-10-CM | POA: Diagnosis not present

## 2024-02-16 MED ORDER — FREESTYLE LIBRE 3 PLUS SENSOR MISC: 11 refills | Status: AC

## 2024-02-16 MED ORDER — EMPAGLIFLOZIN 10 MG PO TABS: 10.0000 mg | ORAL_TABLET | Freq: Every day | ORAL | 0 refills | Status: DC

## 2024-02-16 NOTE — Progress Notes (Signed)
 Established patient visit  Patient: Alexis Patel   DOB: 1966-07-08   58 y.o. Female  MRN: [id number] Visit Date: 02/16/2024  Today's healthcare provider: Blane Bunting, PA-C   Chief Complaint  Patient presents with   Abdominal Pain    Stomach cramping with no relief..   Subjective      Discussed the use of AI scribe software for clinical note transcription with the patient, who gave verbal consent to proceed.  History of Present Illness The patient, with a history of diabetes, presents with complaints of adverse reactions to metformin, including stomach cramps and skin sensitivity. The patient reports that these symptoms have been severe enough to interfere with daily activities, including exercise. The patient has been monitoring blood sugar levels using a continuous glucose monitor, which has revealed some fluctuations, including an unexpected increase in blood sugar levels after showering. The patient also reports episodes of hypoglycemia, with blood sugar levels dropping into the sixties. The patient's blood pressure was elevated at the time of the visit, but the patient has not been regularly monitoring it at home.       01/07/2024    9:02 AM 12/24/2023    8:13 AM 12/16/2023   10:25 AM  Depression screen PHQ 2/9  Decreased Interest 0 0 0  Down, Depressed, Hopeless 0 0 0  PHQ - 2 Score 0 0 0  Altered sleeping  0 1  Tired, decreased energy  0 0  Change in appetite  0 0  Feeling bad or failure about yourself   0 0  Trouble concentrating  0 0  Moving slowly or fidgety/restless  0 0  Suicidal thoughts  0 0  PHQ-9 Score  0 1      12/16/2023   10:27 AM  GAD 7 : Generalized Anxiety Score  Nervous, Anxious, on Edge 0  Control/stop worrying 0  Worry too much - different things 0  Trouble relaxing 0  Restless 0  Easily annoyed or irritable 0  Afraid - awful might happen 0  Total GAD 7 Score 0    Medications: Outpatient Medications Prior to Visit  Medication Sig   B  Complex Vitamins (B COMPLEX PO) Take by mouth.   Cholecalciferol (VITAMIN D-3 PO) Take by mouth.   Estradiol (IMVEXXY MAINTENANCE PACK) 10 MCG INST Place 1 tablet vaginally 2 (two) times a week.   estradiol (VIVELLE-DOT) 0.05 MG/24HR patch Place 1 patch (0.05 mg total) onto the skin 2 (two) times a week.   fluticasone (FLONASE) 50 MCG/ACT nasal spray Place 1 spray into both nostrils daily.   glipiZIDE (GLUCOTROL) 10 MG tablet Take 1 tablet (10 mg total) by mouth 2 (two) times daily before a meal.   liothyronine (CYTOMEL) 5 MCG tablet TAKE 1 TABLET(5 MCG) BY MOUTH DAILY   Magnesium 125 MG CAPS Take by mouth.   Multiple Vitamin (MULTIVITAMIN PO) Take by mouth.   nystatin-triamcinolone ointment (MYCOLOG) Apply 1 Application topically 2 (two) times daily.   progesterone (PROMETRIUM) 100 MG capsule Take 1 capsule (100 mg total) by mouth at bedtime.   rosuvastatin (CRESTOR) 5 MG tablet Take 1 tablet (5 mg total) by mouth daily.   Dulaglutide (TRULICITY) 0.75 MG/0.5ML SOAJ ADMINISTER 0.75 MG UNDER THE SKIN 1 TIME A WEEK (Patient not taking: Reported on 02/16/2024)   metFORMIN (GLUCOPHAGE) 500 MG tablet Take 2 tablets (1,000 mg total) by mouth 2 (two) times daily with a meal. (Patient not taking: Reported on 02/16/2024)   No facility-administered medications prior to visit.  Review of Systems  All other systems reviewed and are negative.  All negative Except see HPI       Objective    BP 132/73 (BP Location: Left Arm, Patient Position: Sitting)   Pulse 90   Resp 16   Wt 163 lb 4.8 oz (74.1 kg)   LMP 11/28/2018   SpO2 100%   BMI 28.93 kg/m     Physical Exam Vitals reviewed.  Constitutional:      General: She is not in acute distress.    Appearance: Normal appearance. She is well-developed. She is not diaphoretic.  HENT:     Head: Normocephalic and atraumatic.  Eyes:     General: No scleral icterus.    Conjunctiva/sclera: Conjunctivae normal.  Neck:     Thyroid: No thyromegaly.   Cardiovascular:     Rate and Rhythm: Normal rate and regular rhythm.     Pulses: Normal pulses.     Heart sounds: Normal heart sounds. No murmur heard. Pulmonary:     Effort: Pulmonary effort is normal. No respiratory distress.     Breath sounds: Normal breath sounds. No wheezing, rhonchi or rales.  Musculoskeletal:     Cervical back: Neck supple.     Right lower leg: No edema.     Left lower leg: No edema.  Lymphadenopathy:     Cervical: No cervical adenopathy.  Skin:    General: Skin is warm and dry.     Findings: No rash.  Neurological:     Mental Status: She is alert and oriented to person, place, and time. Mental status is at baseline.  Psychiatric:        Mood and Affect: Mood normal.        Behavior: Behavior normal.      No results found for any visits on 02/16/24.      Assessment & Plan Type 2 Diabetes Mellitus Abdominal pain Recently diagnosed. Type 2 diabetes with poor glycemic control, A1c at 14%. Intolerance to metformin 500mg  bid and Trulicity 0.75 weekly. Glipizide 10 bid tolerated. Hypoglycemic episodes and glucose elevations post-shower noted. - Prescribe renal-metabolized medication, monitor tolerance. - Provide sample of rybelsus for trial with half tablet to assess side effects. Rx for jardiance was sent to her pharmacy - Continue glipizide, discontinue metformin. - Encourage hydration, proper diet, probiotics. - Consider continuous glucose monitoring system pending authorization. - Investigate glucose elevation post-shower. Will follow-up   Hypoglycemia Hypoglycemic episodes documented - Document episodes with continuous glucose monitoring for medication adjustments. - Advise small, frequent meals.  Hypertension chronic Elevated blood pressure during visit, normal at home. Target <130/80 mmHg, ideally <120/80 mmHg. - Monitor blood pressure at home twice daily, record factors. - Encourage lifestyle modifications. Will FU  General Health  Maintenance No recent ophthalmology evaluation, necessary for diabetic retinopathy screening. - Refer to ophthalmologist for retinal examination.  Follow-up Scheduled follow-up to assess new diabetes medication efficacy and tolerance. - Follow up on May 1st to evaluate medication effectiveness and side effects. - Review blood pressure and glucose monitoring logs.  No orders of the defined types were placed in this encounter.   No follow-ups on file.   The patient was advised to call back or seek an in-person evaluation if the symptoms worsen or if the condition fails to improve as anticipated.  I discussed the assessment and treatment plan with the patient. The patient was provided an opportunity to ask questions and all were answered. The patient agreed with the plan and demonstrated an understanding  of the instructions.  I, Jaeden [place], PA-C have reviewed all documentation for this visit. The documentation on 02/16/2024  for the exam, diagnosis, procedures, and orders are all accurate and complete.  Blane Bunting, [facility], MMS [facility] [phone] (phone) [phone] (fax)  [facility]

## 2024-02-17 ENCOUNTER — Other Ambulatory Visit (HOSPITAL_COMMUNITY): Payer: Self-pay

## 2024-02-17 ENCOUNTER — Telehealth: Payer: Self-pay

## 2024-02-17 NOTE — Telephone Encounter (Signed)
 Pharmacy Patient Advocate Encounter   Received notification from Onbase that prior authorization for FreeStyle Libre 3 Plus Sensor is required/requested.   Insurance verification completed.   The patient is insured through [employer] .   Per test claim: PA required; PA submitted to above mentioned insurance via CoverMyMeds Key/confirmation #/EOC [employer] Status is pending

## 2024-02-18 ENCOUNTER — Other Ambulatory Visit: Payer: Self-pay | Admitting: Physician Assistant

## 2024-02-18 NOTE — Telephone Encounter (Signed)
 Pharmacy Patient Advocate Encounter  Received notification from [employer] that Prior Authorization for FreeStyle Libre 3 Plus Sensor has been DENIED.  Full denial letter will be uploaded to the media tab. See denial reason below.   PA #/Case ID/Reference #: [id number]

## 2024-02-19 DIAGNOSIS — E162 Hypoglycemia, unspecified: Secondary | ICD-10-CM | POA: Insufficient documentation

## 2024-02-22 ENCOUNTER — Encounter: Payer: Self-pay | Admitting: Physician Assistant

## 2024-02-22 DIAGNOSIS — R110 Nausea: Secondary | ICD-10-CM

## 2024-02-23 NOTE — Telephone Encounter (Signed)
 Covering Provider    Please see the message below and advise

## 2024-02-29 MED ORDER — ONDANSETRON HCL 4 MG PO TABS: 4.0000 mg | ORAL_TABLET | Freq: Three times a day (TID) | ORAL | 0 refills | Status: DC | PRN

## 2024-03-09 ENCOUNTER — Ambulatory Visit: Admitting: Physician Assistant

## 2024-03-09 ENCOUNTER — Encounter: Payer: Self-pay | Admitting: Physician Assistant

## 2024-03-09 VITALS — BP 114/73 | HR 91 | Resp 16 | Ht 63.0 in | Wt 161.4 lb

## 2024-03-09 DIAGNOSIS — Z114 Encounter for screening for human immunodeficiency virus [HIV]: Secondary | ICD-10-CM

## 2024-03-09 DIAGNOSIS — K5909 Other constipation: Secondary | ICD-10-CM

## 2024-03-09 DIAGNOSIS — E1159 Type 2 diabetes mellitus with other circulatory complications: Secondary | ICD-10-CM | POA: Diagnosis not present

## 2024-03-09 DIAGNOSIS — G629 Polyneuropathy, unspecified: Secondary | ICD-10-CM

## 2024-03-09 DIAGNOSIS — Z7984 Long term (current) use of oral hypoglycemic drugs: Secondary | ICD-10-CM | POA: Diagnosis not present

## 2024-03-09 DIAGNOSIS — Z1159 Encounter for screening for other viral diseases: Secondary | ICD-10-CM

## 2024-03-09 DIAGNOSIS — E162 Hypoglycemia, unspecified: Secondary | ICD-10-CM

## 2024-03-09 DIAGNOSIS — E119 Type 2 diabetes mellitus without complications: Secondary | ICD-10-CM

## 2024-03-09 DIAGNOSIS — E1169 Type 2 diabetes mellitus with other specified complication: Secondary | ICD-10-CM

## 2024-03-09 DIAGNOSIS — I10 Essential (primary) hypertension: Secondary | ICD-10-CM

## 2024-03-09 MED ORDER — METFORMIN HCL 500 MG PO TABS
500.0000 mg | ORAL_TABLET | Freq: Two times a day (BID) | ORAL | 3 refills | Status: DC
Start: 2024-03-09 — End: 2024-03-10

## 2024-03-09 NOTE — Progress Notes (Signed)
 Established patient visit  Patient: Alexis Patel   DOB: May 15, 1966   58 y.o. Female  MRN: [id number] Visit Date: 03/09/2024  Today's healthcare provider: Blane Bunting, PA-C   Chief Complaint  Patient presents with   Follow-up    F/u oin  chronic Disease . No other concerns   Subjective     HPI     Follow-up    Additional comments: F/u oin  chronic Disease . No other concerns      Last edited by Estill Hemming, CMA on 03/09/2024  8:20 AM.       Discussed the use of AI scribe software for clinical note transcription with the patient, who gave verbal consent to proceed.  History of Present Illness Alexis Patel is a 58 year old female with diabetes who presents for management of her blood sugar levels.  She is experiencing challenges with diabetes management due to issues with her continuous glucose monitor (Libre) not being authorized. Without the [place], she checks her blood sugar more frequently using fingerstick tests, with readings fluctuating between 106 and 120 mg/dL.  She has adverse reactions to several diabetes medications. She previously tried Trulicity but had a reaction. Rybelsus initially seemed tolerable, but by the end of the second week, she experienced severe stomach cramps and discontinued it. She is not taking metformin  due to intolerance but is considering trying the extended-release form. Her current medications include glipizide  twice daily and Jardiance  10 mg daily, which she tolerates well.  She experiences occasional constipation, managed with Miralax and a high-fiber diet. Her feet sometimes feel hot, a sensation more pronounced in the past couple of weeks. No tingling, numbness, or weakness. She is vigilant about her fiber intake and regularly checks food labels to manage her diet.       03/09/2024    8:28 AM 01/07/2024    9:02 AM 12/24/2023    8:13 AM  Depression screen PHQ 2/9  Decreased Interest 0 0 0  Down, Depressed, Hopeless 0 0 0  PHQ - 2  Score 0 0 0  Altered sleeping 2  0  Tired, decreased energy 0  0  Change in appetite 0  0  Feeling bad or failure about yourself  0  0  Trouble concentrating 0  0  Moving slowly or fidgety/restless 0  0  Suicidal thoughts 0  0  PHQ-9 Score 2  0  Difficult doing work/chores Not difficult at all        03/09/2024    8:28 AM 12/16/2023   10:27 AM  GAD 7 : Generalized Anxiety Score  Nervous, Anxious, on Edge 0 0  Control/stop worrying 0 0  Worry too much - different things 0 0  Trouble relaxing 0 0  Restless 0 0  Easily annoyed or irritable 0 0  Afraid - awful might happen 0 0  Total GAD 7 Score 0 0  Anxiety Difficulty Not difficult at all     Medications: Outpatient Medications Prior to Visit  Medication Sig   B Complex Vitamins (B COMPLEX PO) Take by mouth.   Cholecalciferol (VITAMIN D-3 PO) Take by mouth.   Continuous Glucose Sensor (FREESTYLE LIBRE 3 PLUS SENSOR) MISC Change sensor every 15 days.   empagliflozin  (JARDIANCE ) 10 MG TABS tablet Take 1 tablet (10 mg total) by mouth daily before breakfast.   Estradiol  (IMVEXXY  MAINTENANCE PACK) 10 MCG INST Place 1 tablet vaginally 2 (two) times a week.   estradiol  (VIVELLE -DOT) 0.05 MG/24HR patch Place 1 patch (0.05  mg total) onto the skin 2 (two) times a week.   fluticasone (FLONASE) 50 MCG/ACT nasal spray Place 1 spray into both nostrils daily.   glipiZIDE  (GLUCOTROL ) 10 MG tablet Take 1 tablet (10 mg total) by mouth 2 (two) times daily before a meal.   liothyronine  (CYTOMEL ) 5 MCG tablet TAKE 1 TABLET(5 MCG) BY MOUTH DAILY   Magnesium 125 MG CAPS Take by mouth.   Multiple Vitamin (MULTIVITAMIN PO) Take by mouth.   nystatin -triamcinolone  ointment (MYCOLOG) Apply 1 Application topically 2 (two) times daily.   ondansetron  (ZOFRAN ) 4 MG tablet Take 1 tablet (4 mg total) by mouth every 8 (eight) hours as needed for nausea or vomiting.   progesterone  (PROMETRIUM ) 100 MG capsule Take 1 capsule (100 mg total) by mouth at bedtime.    rosuvastatin  (CRESTOR ) 5 MG tablet Take 1 tablet (5 mg total) by mouth daily.   No facility-administered medications prior to visit.    Review of Systems  All other systems reviewed and are negative.  All negative Except see HPI   {Insert previous labs (optional):23779} {See past labs  Heme  Chem  Endocrine  Serology  Results Review (optional):1}   Objective    BP 114/73 (BP Location: Right Arm, Patient Position: Sitting, Cuff Size: Normal)   Pulse 91   Resp 16   Ht 5\' 3"  (1.6 m)   Wt 161 lb 6.4 oz (73.2 kg)   LMP 11/28/2018   SpO2 98%   BMI 28.59 kg/m  {Insert last BP/Wt (optional):23777}{See vitals history (optional):1}   Physical Exam Vitals reviewed.  Constitutional:      General: She is not in acute distress.    Appearance: Normal appearance. She is well-developed. She is not diaphoretic.  HENT:     Head: Normocephalic and atraumatic.  Eyes:     General: No scleral icterus.    Conjunctiva/sclera: Conjunctivae normal.  Neck:     Thyroid : No thyromegaly.  Cardiovascular:     Rate and Rhythm: Normal rate and regular rhythm.     Pulses: Normal pulses.     Heart sounds: Normal heart sounds. No murmur heard. Pulmonary:     Effort: Pulmonary effort is normal. No respiratory distress.     Breath sounds: Normal breath sounds. No wheezing, rhonchi or rales.  Musculoskeletal:     Cervical back: Neck supple.     Right lower leg: No edema.     Left lower leg: No edema.  Lymphadenopathy:     Cervical: No cervical adenopathy.  Skin:    General: Skin is warm and dry.     Findings: No rash.  Neurological:     Mental Status: She is alert and oriented to person, place, and time. Mental status is at baseline.  Psychiatric:        Mood and Affect: Mood normal.        Behavior: Behavior normal.      No results found for any visits on 03/09/24.      Assessment & Plan Type 2 diabetes mellitus Type 2 diabetes with hypoglycemia episodes. Current treatment includes  glipizide  and Jardiance . Previous adverse reactions to Trulicity and Rybelsus. Discussed metformin  reintroduction and importance of glucose monitoring. - Reintroduce metformin  extended-release 500 mg twice daily. - Continue glipizide  twice daily. - Continue Jardiance  10 mg daily. - Monitor blood glucose levels closely, especially if unwell. - Increase Jardiance  to twice daily if blood glucose rises significantly. - Perform blood work and urine tests for kidney function and protein levels. -  Follow-up in six weeks to reassess diabetes management.  Peripheral neuropathy symptoms Reports hot sensation in feet without tingling or numbness. No neuropathy evidence on examination. Emphasized glucose control to prevent neuropathy. - Monitor blood glucose levels to prevent neuropathy progression. - Advise on daily foot care and monitoring for changes.  Constipation Occasional constipation managed with Miralax and high fiber diet. - Continue high fiber diet. - Use Miralax as needed.  General Health Maintenance Pending vaccinations for shingles and pneumonia. Eye exam scheduled. Cologuard test normal. Discussed hepatitis C and HIV screening. - Proceed with eye exam on May 5th. - Consider hepatitis C and HIV screening during upcoming lab work. - Plan for shingles and pneumonia vaccinations later.    Orders Placed This Encounter  Procedures   Microalbumin / creatinine urine ratio   Hepatitis C antibody   HIV Antibody (routine testing w rflx)    Return in about 6 weeks (around 04/20/2024).   The patient was advised to call back or seek an in-person evaluation if the symptoms worsen or if the condition fails to improve as anticipated.  I discussed the assessment and treatment plan with the patient. The patient was provided an opportunity to ask questions and all were answered. The patient agreed with the plan and demonstrated an understanding of the instructions.  I, Jaira Canady, PA-C have  reviewed all documentation for this visit. The documentation on 03/09/2024  for the exam, diagnosis, procedures, and orders are all accurate and complete.  Blane Bunting, [facility], MMS [facility] [phone] (phone) [phone] (fax)  [facility]

## 2024-03-10 ENCOUNTER — Telehealth: Payer: Self-pay | Admitting: Pharmacist

## 2024-03-10 ENCOUNTER — Telehealth: Payer: Self-pay

## 2024-03-10 ENCOUNTER — Telehealth: Payer: Self-pay | Admitting: Physician Assistant

## 2024-03-10 DIAGNOSIS — E119 Type 2 diabetes mellitus without complications: Secondary | ICD-10-CM

## 2024-03-10 LAB — HIV ANTIBODY (ROUTINE TESTING W REFLEX): HIV Screen 4th Generation wRfx: NONREACTIVE

## 2024-03-10 LAB — SPECIMEN STATUS REPORT

## 2024-03-10 LAB — HEPATITIS C ANTIBODY: Hep C Virus Ab: NONREACTIVE

## 2024-03-10 LAB — MICROALBUMIN / CREATININE URINE RATIO
Creatinine, Urine: 46.6 mg/dL
Microalb/Creat Ratio: 7 mg/g{creat} (ref 0–29)
Microalbumin, Urine: 3.3 ug/mL

## 2024-03-10 MED ORDER — METFORMIN HCL 500 MG PO TABS
500.0000 mg | ORAL_TABLET | Freq: Two times a day (BID) | ORAL | 3 refills | Status: DC
Start: 2024-03-10 — End: 2024-03-13

## 2024-03-10 NOTE — Addendum Note (Signed)
 Addended by: Estill Hemming on: 03/10/2024 02:35 PM   Modules accepted: Orders

## 2024-03-10 NOTE — Telephone Encounter (Signed)
 Copied from CRM [id number]. Topic: Clinical - Prescription Issue >> Mar 10, 2024 10:11 AM Felizardo Hotter wrote: Reason for CRM: Pt called regarding metFORMIN  (GLUCOPHAGE ) 500 MG tablet was supposed to be extended release that [facility] PA changed it to extended release but pharmacy received the regular release.

## 2024-03-10 NOTE — Telephone Encounter (Signed)
 Copied from CRM [id number]. Topic: Clinical - Medication Question >> Mar 10, 2024 10:25 AM Georgeann Kindred wrote: Reason for CRM: Patient calling regarding the incorrect medication sent to pharmacy. She states that a prescription was sent in for the regular Metformin . She indicates that it should be Metformin  Extended Release as per her provider during the last appointment. Looked through visit and seen that it stated Reintroduce Metformin  Extended Release 500 MG twice daily (03/09/24)  and the note from PA Ostwalt that stated patient was never on the Metformin  Extended Release (03/10/24). Please contact patient at [phone].

## 2024-03-10 NOTE — Addendum Note (Signed)
 Addended by: Estill Hemming on: 03/10/2024 02:44 PM   Modules accepted: Orders

## 2024-03-10 NOTE — Telephone Encounter (Signed)
 Appeal has been submitted for [employer] 3. Will advise when response is received, please be advised that most companies may take 30 days to make a decision. Appeal letter and supporting documentation have been faxed to [phone] on 03/10/2024 @7 :58 am.  Thank you, Dene Fines, PharmD Clinical Pharmacist  [place]  Direct Dial: [phone]

## 2024-03-10 NOTE — Telephone Encounter (Signed)
 Patient called, Alexis Patel stated that the pt is very adamant about receiving the extended release metformin   and wants a return phone to discuss it. Please follow up with patient.

## 2024-03-10 NOTE — Telephone Encounter (Signed)
 Ok to update rx to extended release?

## 2024-03-11 DIAGNOSIS — G629 Polyneuropathy, unspecified: Secondary | ICD-10-CM | POA: Insufficient documentation

## 2024-03-13 ENCOUNTER — Encounter: Payer: Self-pay | Admitting: Physician Assistant

## 2024-03-13 ENCOUNTER — Other Ambulatory Visit: Payer: Self-pay | Admitting: Physician Assistant

## 2024-03-13 LAB — HM DIABETES EYE EXAM

## 2024-03-13 MED ORDER — METFORMIN HCL ER 500 MG PO TB24: ORAL_TABLET | ORAL | 1 refills | Status: DC

## 2024-03-13 NOTE — Addendum Note (Signed)
 Addended by: Lucy Boardman E on: 03/13/2024 01:54 PM   Modules accepted: Orders

## 2024-03-13 NOTE — Telephone Encounter (Signed)
 Please review Metformin  Rx- patient has requested extended release  Copied from CRM [id number]. Topic: Clinical - Prescription Issue >> Mar 13, 2024  9:22 AM Tiffany B wrote: Reason for CRM: Patient states medication should be the extended release for metFORMIN  (GLUCOPHAGE ) 500 MG tablet. Patient would like a follow up call prior to 3pm regarding the status of new rx

## 2024-03-15 ENCOUNTER — Other Ambulatory Visit: Payer: Self-pay | Admitting: Physician Assistant

## 2024-03-15 DIAGNOSIS — E119 Type 2 diabetes mellitus without complications: Secondary | ICD-10-CM

## 2024-03-19 ENCOUNTER — Other Ambulatory Visit: Payer: Self-pay | Admitting: Physician Assistant

## 2024-03-19 DIAGNOSIS — E119 Type 2 diabetes mellitus without complications: Secondary | ICD-10-CM

## 2024-03-21 NOTE — Telephone Encounter (Signed)
 [employer] has denied the appeal for Alexis Patel.  Denial letter has been uploaded under the patient's media tab.

## 2024-04-01 ENCOUNTER — Encounter: Payer: Self-pay | Admitting: Physician Assistant

## 2024-04-04 NOTE — Telephone Encounter (Signed)
 FYI pease see the message below.

## 2024-04-19 ENCOUNTER — Other Ambulatory Visit: Payer: Self-pay | Admitting: Physician Assistant

## 2024-04-19 DIAGNOSIS — E1365 Other specified diabetes mellitus with hyperglycemia: Secondary | ICD-10-CM

## 2024-04-20 ENCOUNTER — Ambulatory Visit: Admitting: Physician Assistant

## 2024-04-20 VITALS — BP 117/74 | HR 69 | Resp 16 | Ht 65.0 in | Wt 156.0 lb

## 2024-04-20 DIAGNOSIS — G629 Polyneuropathy, unspecified: Secondary | ICD-10-CM

## 2024-04-20 DIAGNOSIS — Z7984 Long term (current) use of oral hypoglycemic drugs: Secondary | ICD-10-CM

## 2024-04-20 DIAGNOSIS — Z7985 Long-term (current) use of injectable non-insulin antidiabetic drugs: Secondary | ICD-10-CM

## 2024-04-20 DIAGNOSIS — E162 Hypoglycemia, unspecified: Secondary | ICD-10-CM

## 2024-04-20 DIAGNOSIS — E063 Autoimmune thyroiditis: Secondary | ICD-10-CM

## 2024-04-20 DIAGNOSIS — E049 Nontoxic goiter, unspecified: Secondary | ICD-10-CM | POA: Diagnosis not present

## 2024-04-20 DIAGNOSIS — E119 Type 2 diabetes mellitus without complications: Secondary | ICD-10-CM

## 2024-04-20 MED ORDER — TRULICITY 0.75 MG/0.5ML ~~LOC~~ SOAJ: 0.7500 mg | SUBCUTANEOUS | 1 refills | Status: DC

## 2024-04-20 MED ORDER — EMPAGLIFLOZIN 10 MG PO TABS: 10.0000 mg | ORAL_TABLET | Freq: Every day | ORAL | 3 refills | Status: AC

## 2024-04-20 NOTE — Progress Notes (Signed)
 Established patient visit  Patient: Alexis Patel   DOB: 10/08/1966   57 y.o. Female  MRN: [id number] Visit Date: 04/20/2024  Today's healthcare provider: Blane Bunting, PA-C   Chief Complaint  Patient presents with   Follow-up    6 wk f/u(DM) Pt wants  new Rx Trulicity.    Subjective     HPI     Follow-up    Additional comments: 6 wk f/u(DM) Pt wants  new Rx Trulicity.       Last edited by Estill Hemming, CMA on 04/20/2024  9:14 AM.       Discussed the use of AI scribe software for clinical note transcription with the patient, who gave verbal consent to proceed.  History of Present Illness   Discussed the use of AI scribe software for clinical note transcription with the patient, who gave verbal consent to proceed.  History of Present Illness   Alexis Patel is a 58 year old female with diabetes who presents for follow-up on blood sugar management and neuropathy symptoms.  Blood sugar levels have improved with Trulicity, with readings around 100 mg/dL and occasional spikes. No significant hypoglycemic episodes during the day, however, she is not sure that she might have episodes during the night. She cannot obtain the [place] 3 due to insurance, complicating nocturnal hypoglycemia monitoring. Current medications include Jardiance  10 mg daily, glipizide  twice daily, and Trulicity 0.75 mg weekly. Trulicity is now well-tolerated after stopping metformin .  She experiences burning sensations in her feet and hypersensitivity on her legs and hips, slightly improved with better blood sugar control. The burning sensation is bothersome at night, occasionally affecting sleep. No numbness, tingling, or other sensory changes.          03/09/2024    8:28 AM 01/07/2024    9:02 AM 12/24/2023    8:13 AM  Depression screen PHQ 2/9  Decreased Interest 0 0 0  Down, Depressed, Hopeless 0 0 0  PHQ - 2 Score 0 0 0  Altered sleeping 2  0  Tired, decreased energy 0  0  Change in appetite 0  0   Feeling bad or failure about yourself  0  0  Trouble concentrating 0  0  Moving slowly or fidgety/restless 0  0  Suicidal thoughts 0  0  PHQ-9 Score 2  0  Difficult doing work/chores Not difficult at all        03/09/2024    8:28 AM 12/16/2023   10:27 AM  GAD 7 : Generalized Anxiety Score  Nervous, Anxious, on Edge 0 0  Control/stop worrying 0 0  Worry too much - different things 0 0  Trouble relaxing 0 0  Restless 0 0  Easily annoyed or irritable 0 0  Afraid - awful might happen 0 0  Total GAD 7 Score 0 0  Anxiety Difficulty Not difficult at all     Medications: Outpatient Medications Prior to Visit  Medication Sig   B Complex Vitamins (B COMPLEX PO) Take by mouth.   Cholecalciferol (VITAMIN D-3 PO) Take by mouth.   Continuous Glucose Sensor (FREESTYLE LIBRE 3 PLUS SENSOR) MISC Change sensor every 15 days.   Estradiol  (IMVEXXY  MAINTENANCE PACK) 10 MCG INST Place 1 tablet vaginally 2 (two) times a week.   estradiol  (VIVELLE -DOT) 0.05 MG/24HR patch Place 1 patch (0.05 mg total) onto the skin 2 (two) times a week.   fluticasone (FLONASE) 50 MCG/ACT nasal spray Place 1 spray into both nostrils daily.   glipiZIDE  (GLUCOTROL ) 10  MG tablet Take 1 tablet (10 mg total) by mouth 2 (two) times daily before a meal.   JARDIANCE  10 MG TABS tablet TAKE 1 TABLET(10 MG) BY MOUTH DAILY BEFORE BREAKFAST   liothyronine  (CYTOMEL ) 5 MCG tablet TAKE 1 TABLET(5 MCG) BY MOUTH DAILY   Magnesium 125 MG CAPS Take by mouth.   metFORMIN  (GLUCOPHAGE -XR) 500 MG 24 hr tablet Week 1: take one tablet daily in the morning with breakfast.  Week 2: Take 1 tablet twice daily with meals.   Multiple Vitamin (MULTIVITAMIN PO) Take by mouth. (Patient not taking: Reported on 04/20/2024)   nystatin -triamcinolone  ointment (MYCOLOG) Apply 1 Application topically 2 (two) times daily.   ondansetron  (ZOFRAN ) 4 MG tablet Take 1 tablet (4 mg total) by mouth every 8 (eight) hours as needed for nausea or vomiting.   progesterone   (PROMETRIUM ) 100 MG capsule Take 1 capsule (100 mg total) by mouth at bedtime.   rosuvastatin  (CRESTOR ) 5 MG tablet Take 1 tablet (5 mg total) by mouth daily.   No facility-administered medications prior to visit.    Review of Systems All negative Except see HPI       Objective    BP 117/74 (BP Location: Right Arm, Patient Position: Sitting, Cuff Size: Normal)   Pulse 69   Resp 16   Ht 5' 5 (1.651 m)   Wt 156 lb (70.8 kg)   LMP 11/28/2018   SpO2 100%   BMI 25.96 kg/m     Physical Exam Vitals reviewed.  Constitutional:      General: She is not in acute distress.    Appearance: Normal appearance. She is well-developed. She is not diaphoretic.  HENT:     Head: Normocephalic and atraumatic.   Eyes:     General: No scleral icterus.    Conjunctiva/sclera: Conjunctivae normal.   Neck:     Thyroid : No thyromegaly.   Cardiovascular:     Rate and Rhythm: Normal rate and regular rhythm.     Pulses: Normal pulses.     Heart sounds: Normal heart sounds. No murmur heard. Pulmonary:     Effort: Pulmonary effort is normal. No respiratory distress.     Breath sounds: Normal breath sounds. No wheezing, rhonchi or rales.   Musculoskeletal:     Cervical back: Neck supple.     Right lower leg: No edema.     Left lower leg: No edema.  Lymphadenopathy:     Cervical: No cervical adenopathy.   Skin:    General: Skin is warm and dry.     Findings: No rash.   Neurological:     Mental Status: She is alert and oriented to person, place, and time. Mental status is at baseline.   Psychiatric:        Mood and Affect: Mood normal.        Behavior: Behavior normal.      No results found for any visits on 04/20/24.      Assessment & Plan      Type 2 Diabetes Mellitus Blood glucose improved with Trulicity, averaging 100 mg/dL, occasional spikes to 308 mg/dL. No significant hypoglycemia. Trulicity preferred over metformin  due to GI side effects. Insurance approval  pending. Continuous blood glucose: 81 mg/dL (65/78/4696) Continuous blood glucose: 86 mg/dL (29/52/8413) Continuous blood glucose: 100 mg/dL (24/40/1027) Continuous blood glucose: 102 mg/dL (25/36/6440) - Provide Libre for glucose monitoring. - Instruct to document hypoglycemic episodes and related activities. - Prescribe Trulicity 0.75 mg and pursue insurance approval. - Order A1c test. -  Prescribe Jardiance  10 mg for one year.  Peripheral Neuropathy chronic could be due to DM Burning sensations in feet and hypersensitivity in legs, slightly improved with glucose control. Suspected diabetic neuropathy, further evaluation needed. - Refer to neurology for further evaluation.  Enlarged thyroid /Hashimoto thyroiditis Thyroid  ultrasound previously canceled. Concerns about thyroid  appearance necessitate rescheduling. - Reschedule thyroid  ultrasound.  General Health Maintenance Diabetic eye exam normal. Emphasized regular monitoring and follow-up for diabetes management. A1c testing due today. - Schedule follow-up in three months. - Instruct to contact if out of Trulicity.        No orders of the defined types were placed in this encounter.   No follow-ups on file.   The patient was advised to call back or seek an in-person evaluation if the symptoms worsen or if the condition fails to improve as anticipated.  I discussed the assessment and treatment plan with the patient. The patient was provided an opportunity to ask questions and all were answered. The patient agreed with the plan and demonstrated an understanding of the instructions.  I, Sharine Cadle, PA-C have reviewed all documentation for this visit. The documentation on 04/20/2024  for the exam, diagnosis, procedures, and orders are all accurate and complete.  Blane Bunting, [facility], MMS [facility] [phone] (phone) [phone] (fax)  [facility]

## 2024-04-21 ENCOUNTER — Ambulatory Visit: Payer: Self-pay | Admitting: Physician Assistant

## 2024-04-21 LAB — BASIC METABOLIC PANEL WITH GFR
BUN/Creatinine Ratio: 17 (ref 9–23)
BUN: 11 mg/dL (ref 6–24)
CO2: 19 mmol/L — ABNORMAL LOW (ref 20–29)
Calcium: 9.9 mg/dL (ref 8.7–10.2)
Chloride: 99 mmol/L (ref 96–106)
Creatinine, Ser: 0.64 mg/dL (ref 0.57–1.00)
Glucose: 96 mg/dL (ref 70–99)
Potassium: 4.3 mmol/L (ref 3.5–5.2)
Sodium: 136 mmol/L (ref 134–144)
eGFR: 102 mL/min/{1.73_m2} (ref >59)

## 2024-04-21 LAB — HEMOGLOBIN A1C
Est. average glucose Bld gHb Est-mCnc: 103 mg/dL
Hgb A1c MFr Bld: 5.2 % (ref 4.8–5.6)

## 2024-04-27 ENCOUNTER — Other Ambulatory Visit: Payer: Self-pay | Admitting: Radiology

## 2024-04-27 DIAGNOSIS — Z79890 Hormone replacement therapy (postmenopausal): Secondary | ICD-10-CM

## 2024-04-27 MED ORDER — ESTRADIOL 0.05 MG/24HR TD PTTW: 1.0000 | MEDICATED_PATCH | TRANSDERMAL | 0 refills | Status: DC

## 2024-05-08 ENCOUNTER — Encounter: Payer: Self-pay | Admitting: Physician Assistant

## 2024-05-18 ENCOUNTER — Other Ambulatory Visit: Payer: Self-pay | Admitting: Physician Assistant

## 2024-05-24 ENCOUNTER — Other Ambulatory Visit: Payer: Self-pay | Admitting: Physician Assistant

## 2024-06-01 ENCOUNTER — Other Ambulatory Visit: Payer: Self-pay | Admitting: Physician Assistant

## 2024-06-01 DIAGNOSIS — E119 Type 2 diabetes mellitus without complications: Secondary | ICD-10-CM

## 2024-06-27 ENCOUNTER — Other Ambulatory Visit: Payer: Self-pay | Admitting: Physician Assistant

## 2024-06-27 DIAGNOSIS — E119 Type 2 diabetes mellitus without complications: Secondary | ICD-10-CM

## 2024-07-21 ENCOUNTER — Encounter: Payer: Self-pay | Admitting: Physician Assistant

## 2024-07-21 ENCOUNTER — Ambulatory Visit: Admitting: Physician Assistant

## 2024-07-21 VITALS — BP 129/85 | HR 78 | Resp 14 | Ht 65.0 in | Wt 161.3 lb

## 2024-07-21 DIAGNOSIS — I10 Essential (primary) hypertension: Secondary | ICD-10-CM

## 2024-07-21 DIAGNOSIS — E1140 Type 2 diabetes mellitus with diabetic neuropathy, unspecified: Secondary | ICD-10-CM

## 2024-07-21 DIAGNOSIS — E063 Autoimmune thyroiditis: Secondary | ICD-10-CM

## 2024-07-21 DIAGNOSIS — G629 Polyneuropathy, unspecified: Secondary | ICD-10-CM

## 2024-07-21 DIAGNOSIS — E7849 Other hyperlipidemia: Secondary | ICD-10-CM | POA: Diagnosis not present

## 2024-07-21 DIAGNOSIS — E119 Type 2 diabetes mellitus without complications: Secondary | ICD-10-CM

## 2024-07-21 NOTE — Progress Notes (Signed)
 " Established patient visit  Patient: Alexis Patel   DOB: 10-06-1966   57 y.o. Female  MRN: [id number] Visit Date: 07/21/2024  Today's healthcare provider: Jolynn Spencer, PA-C   Chief Complaint  Patient presents with   Medical Management of Chronic Issues   Subjective       Discussed the use of AI scribe software for clinical note transcription with the patient, who gave verbal consent to proceed.  History of Present Illness Alexis Patel is a 58 year old female with diabetes who presents for follow-up on blood sugar management and neuropathy symptoms.  Blood sugar levels are stable, ranging between 90 and 120 mg/dL, with occasional postprandial elevations. She uses Trulicity  and glipizide , 10 mg twice daily, and monitors glucose with finger pricks. During a recent vacation, she experienced hypoglycemia with glucose levels in the 80s, resolved by increasing food intake.  She experiences burning sensations in her feet and lower legs, similar to a sunburn, following a rapid decrease in A1c levels. Gabapentin  200 mg three times daily helps manage symptoms, though she has gained about five pounds since starting the medication. Pain varies daily and may worsen with fatigue. No restless leg syndrome, chest pain, shortness of breath, or palpitations.  She follows dietary changes and stays hydrated. Exercise is limited due to foot pain, but she plans to return to the gym. She has lost some weight. Her recent eye exam was normal. She stopped taking B vitamins due to previously elevated B1 levels. A thyroid  ultrasound is scheduled for next week, and she has a neurology follow-up in October.       03/09/2024    8:28 AM 01/07/2024    9:02 AM 12/24/2023    8:13 AM  Depression screen PHQ 2/9  Decreased Interest 0 0 0  Down, Depressed, Hopeless 0 0 0  PHQ - 2 Score 0 0 0  Altered sleeping 2  0  Tired, decreased energy 0  0  Change in appetite 0  0  Feeling bad or failure about yourself  0  0   Trouble concentrating 0  0  Moving slowly or fidgety/restless 0  0  Suicidal thoughts 0  0  PHQ-9 Score 2  0  Difficult doing work/chores Not difficult at all        03/09/2024    8:28 AM 12/16/2023   10:27 AM  GAD 7 : Generalized Anxiety Score  Nervous, Anxious, on Edge 0 0  Control/stop worrying 0 0  Worry too much - different things 0 0  Trouble relaxing 0 0  Restless 0 0  Easily annoyed or irritable 0 0  Afraid - awful might happen 0 0  Total GAD 7 Score 0 0  Anxiety Difficulty Not difficult at all     Medications: Outpatient Medications Prior to Visit  Medication Sig   B Complex Vitamins (B COMPLEX PO) Take by mouth.   Cholecalciferol (VITAMIN D-3 PO) Take by mouth.   Continuous Glucose Sensor (FREESTYLE LIBRE 3 PLUS SENSOR) MISC Change sensor every 15 days.   empagliflozin  (JARDIANCE ) 10 MG TABS tablet Take 1 tablet (10 mg total) by mouth daily.   estradiol  (VIVELLE -DOT) 0.05 MG/24HR patch Place 1 patch (0.05 mg total) onto the skin 2 (two) times a week.   fluticasone (FLONASE) 50 MCG/ACT nasal spray Place 1 spray into both nostrils daily.   glipiZIDE  (GLUCOTROL ) 10 MG tablet TAKE 1 TABLET(10 MG) BY MOUTH TWICE DAILY BEFORE A MEAL   liothyronine  (CYTOMEL ) 5 MCG tablet TAKE 1  TABLET(5 MCG) BY MOUTH DAILY   Magnesium 125 MG CAPS Take by mouth.   Multiple Vitamin (MULTIVITAMIN PO) Take by mouth.   progesterone  (PROMETRIUM ) 100 MG capsule Take 1 capsule (100 mg total) by mouth at bedtime.   rosuvastatin  (CRESTOR ) 5 MG tablet Take 1 tablet (5 mg total) by mouth daily.   TRULICITY  0.75 MG/0.5ML SOAJ ADMINISTER 0.75 MG UNDER THE SKIN 1 TIME A WEEK   [DISCONTINUED] ondansetron  (ZOFRAN ) 4 MG tablet Take 1 tablet (4 mg total) by mouth every 8 (eight) hours as needed for nausea or vomiting.   No facility-administered medications prior to visit.    Review of Systems  All other systems reviewed and are negative.  All negative Except see HPI       Objective    BP 129/85    Pulse 78   Resp 14   Ht 5' 5 (1.651 m)   Wt 161 lb 4.8 oz (73.2 kg)   LMP 11/28/2018   BMI 26.84 kg/m     Physical Exam Vitals reviewed.  Constitutional:      General: She is not in acute distress.    Appearance: Normal appearance. She is well-developed. She is not diaphoretic.  HENT:     Head: Normocephalic and atraumatic.  Eyes:     General: No scleral icterus.    Conjunctiva/sclera: Conjunctivae normal.  Neck:     Thyroid : No thyromegaly.  Cardiovascular:     Rate and Rhythm: Normal rate and regular rhythm.     Pulses: Normal pulses.     Heart sounds: Normal heart sounds. No murmur heard. Pulmonary:     Effort: Pulmonary effort is normal. No respiratory distress.     Breath sounds: Normal breath sounds. No wheezing, rhonchi or rales.  Musculoskeletal:     Cervical back: Neck supple.     Right lower leg: No edema.     Left lower leg: No edema.  Lymphadenopathy:     Cervical: No cervical adenopathy.  Skin:    General: Skin is warm and dry.     Findings: No rash.  Neurological:     Mental Status: She is alert and oriented to person, place, and time. Mental status is at baseline.  Psychiatric:        Mood and Affect: Mood normal.        Behavior: Behavior normal.      Results for orders placed or performed in visit on 07/21/24  Hemoglobin A1c  Result Value Ref Range   Hgb A1c MFr Bld 4.9 4.8 - 5.6 %   Est. average glucose Bld gHb Est-mCnc 94 mg/dL  Basic metabolic panel with GFR  Result Value Ref Range   Glucose 84 70 - 99 mg/dL   BUN 14 6 - 24 mg/dL   Creatinine, Ser 9.35 0.57 - 1.00 mg/dL   eGFR 897 >40 fO/fpw/8.26   BUN/Creatinine Ratio 22 9 - 23   Sodium 137 134 - 144 mmol/L   Potassium 4.3 3.5 - 5.2 mmol/L   Chloride 101 96 - 106 mmol/L   CO2 20 20 - 29 mmol/L   Calcium  10.0 8.7 - 10.2 mg/dL        Assessment & Plan Type 2 diabetes mellitus with diabetic neuropathy, lower extremities Chronic Blood sugar well-controlled. Neuropathy in lower  extremities, likely treatment-induced. Gabapentin  effective. Awaiting nerve conduction study results. Neurologist suggests temporary neuropathy. - Continue Trulicity  and glipizide  10 bid. - Continue gabapentin  200 mg TID. - Order A1c and kidney function tests. -  Follow up with neurology for nerve conduction study results. Neuropathy could be resolved spontaneously over 12-24 months after stabilization of blood sugar. Will follow-up   Morbid obesity Chronic Improving Weight loss noted, improving blood pressure, blood sugar, and cholesterol. Neuropathic pain limits exercise. - Encourage dietary adherence. - Encourage resumption of gym activities. - Consider swimming as low-impact exercise. Will follow-up  hashimoto thyroiditis Thyroid  ultrasound scheduled. Previous thyroid  check normal, re-evaluation needed. - Perform thyroid  ultrasound. Will monitor  Diabetes mellitus without complication (HCC) (Primary)  - Hemoglobin A1c - Basic metabolic panel with GFR  Primary hypertension Chornic and stable Continue low salt diet and exercise Will follow-up  Other hyperlipidemia Chornic and stable Continue low cholesterol diet and exercise Will follow-up    Orders Placed This Encounter  Procedures   Hemoglobin A1c   Basic metabolic panel with GFR    Return for chronic disease f/u in 3-4 mo.   The patient was advised to call back or seek an in-person evaluation if the symptoms worsen or if the condition fails to improve as anticipated.  I discussed the assessment and treatment plan with the patient. The patient was provided an opportunity to ask questions and all were answered. The patient agreed with the plan and demonstrated an understanding of the instructions.  I, Kosha Jaquith, PA-C have reviewed all documentation for this visit. The documentation on 07/21/2024  for the exam, diagnosis, procedures, and orders are all accurate and complete.  Jolynn Spencer, [facility], MMS [facility] [phone] (phone) [phone] (fax)  [facility] "

## 2024-07-22 LAB — HEMOGLOBIN A1C
Est. average glucose Bld gHb Est-mCnc: 94 mg/dL
Hgb A1c MFr Bld: 4.9 % (ref 4.8–5.6)

## 2024-07-22 LAB — BASIC METABOLIC PANEL WITH GFR
BUN/Creatinine Ratio: 22 (ref 9–23)
BUN: 14 mg/dL (ref 6–24)
CO2: 20 mmol/L (ref 20–29)
Calcium: 10 mg/dL (ref 8.7–10.2)
Chloride: 101 mmol/L (ref 96–106)
Creatinine, Ser: 0.64 mg/dL (ref 0.57–1.00)
Glucose: 84 mg/dL (ref 70–99)
Potassium: 4.3 mmol/L (ref 3.5–5.2)
Sodium: 137 mmol/L (ref 134–144)
eGFR: 102 mL/min/1.73 (ref >59)

## 2024-07-23 ENCOUNTER — Ambulatory Visit: Payer: Self-pay | Admitting: Physician Assistant

## 2024-07-23 DIAGNOSIS — E063 Autoimmune thyroiditis: Secondary | ICD-10-CM | POA: Insufficient documentation

## 2024-07-27 ENCOUNTER — Ambulatory Visit
Admission: RE | Admit: 2024-07-27 | Discharge: 2024-07-27 | Disposition: A | Discharge status: Home / Self Care | Source: Ambulatory Visit | Attending: Physician Assistant | Admitting: Physician Assistant

## 2024-07-27 DIAGNOSIS — E063 Autoimmune thyroiditis: Secondary | ICD-10-CM | POA: Diagnosis present

## 2024-07-31 ENCOUNTER — Other Ambulatory Visit: Payer: Self-pay | Admitting: Physician Assistant

## 2024-07-31 ENCOUNTER — Ambulatory Visit: Payer: Self-pay | Admitting: Physician Assistant

## 2024-07-31 DIAGNOSIS — E1169 Type 2 diabetes mellitus with other specified complication: Secondary | ICD-10-CM

## 2024-08-20 ENCOUNTER — Other Ambulatory Visit: Payer: Self-pay | Admitting: Physician Assistant

## 2024-08-20 DIAGNOSIS — E119 Type 2 diabetes mellitus without complications: Secondary | ICD-10-CM

## 2024-09-25 ENCOUNTER — Other Ambulatory Visit: Payer: Self-pay | Admitting: Physician Assistant

## 2024-09-25 DIAGNOSIS — E119 Type 2 diabetes mellitus without complications: Secondary | ICD-10-CM

## 2024-10-06 ENCOUNTER — Encounter: Payer: Self-pay | Admitting: Physician Assistant

## 2024-10-08 ENCOUNTER — Other Ambulatory Visit: Payer: Self-pay | Admitting: Radiology

## 2024-10-08 ENCOUNTER — Other Ambulatory Visit: Payer: Self-pay | Admitting: Physician Assistant

## 2024-10-08 DIAGNOSIS — Z79890 Hormone replacement therapy (postmenopausal): Secondary | ICD-10-CM

## 2024-10-08 DIAGNOSIS — E119 Type 2 diabetes mellitus without complications: Secondary | ICD-10-CM

## 2024-10-09 ENCOUNTER — Other Ambulatory Visit: Payer: Self-pay

## 2024-10-09 DIAGNOSIS — E119 Type 2 diabetes mellitus without complications: Secondary | ICD-10-CM

## 2024-10-09 MED ORDER — GLIPIZIDE 10 MG PO TABS: 10.0000 mg | ORAL_TABLET | Freq: Two times a day (BID) | ORAL | 1 refills | Status: AC

## 2024-10-09 NOTE — Telephone Encounter (Signed)
 Med refill request: vivelle   Last AEX: 01/07/24  Next AEX: not scheduled  Last MMG (if hormonal med) 12/28/23 birads cat 1 neg  Refill authorized: last rx 04/27/24 #8 with 0 refills. Please advise

## 2024-10-09 NOTE — Telephone Encounter (Signed)
 Med has been sent.

## 2024-11-04 ENCOUNTER — Other Ambulatory Visit: Payer: Self-pay | Admitting: Radiology

## 2024-11-04 DIAGNOSIS — Z79890 Hormone replacement therapy (postmenopausal): Secondary | ICD-10-CM

## 2024-11-06 NOTE — Telephone Encounter (Signed)
 Med refill request:   Estradiol  (VIVELLE -DOT) 0.05 MG/24HR patch  Start:  10/10/24 Disp:  24 patches   Refills:  0 *Pt is requesting 90 day supply* Last AEX: 01/07/24 Next AEX:  Not yet scheduled  *Front desk has been asked to contact Pt to schedule an annual visit. Last MMG (if hormonal med): 12/28/23 Refill authorized? Please Advise.

## 2024-11-12 ENCOUNTER — Other Ambulatory Visit: Payer: Self-pay | Admitting: Physician Assistant

## 2024-11-15 ENCOUNTER — Encounter: Payer: Self-pay | Admitting: Physician Assistant

## 2024-11-15 MED ORDER — LIOTHYRONINE SODIUM 5 MCG PO TABS: ORAL_TABLET | ORAL | 1 refills | Status: AC

## 2024-11-20 ENCOUNTER — Encounter: Payer: Self-pay | Admitting: Physician Assistant

## 2024-11-20 ENCOUNTER — Ambulatory Visit: Admitting: Physician Assistant

## 2024-11-20 VITALS — BP 133/82 | HR 72 | Resp 14 | Ht 63.0 in | Wt 188.8 lb

## 2024-11-20 DIAGNOSIS — E119 Type 2 diabetes mellitus without complications: Secondary | ICD-10-CM

## 2024-11-20 DIAGNOSIS — E063 Autoimmune thyroiditis: Secondary | ICD-10-CM | POA: Diagnosis not present

## 2024-11-20 DIAGNOSIS — I152 Hypertension secondary to endocrine disorders: Secondary | ICD-10-CM | POA: Diagnosis not present

## 2024-11-20 DIAGNOSIS — E1169 Type 2 diabetes mellitus with other specified complication: Secondary | ICD-10-CM

## 2024-11-20 DIAGNOSIS — E1159 Type 2 diabetes mellitus with other circulatory complications: Secondary | ICD-10-CM | POA: Diagnosis not present

## 2024-11-20 DIAGNOSIS — E785 Hyperlipidemia, unspecified: Secondary | ICD-10-CM

## 2024-11-20 DIAGNOSIS — Z7985 Long-term (current) use of injectable non-insulin antidiabetic drugs: Secondary | ICD-10-CM | POA: Diagnosis not present

## 2024-11-20 DIAGNOSIS — Z1211 Encounter for screening for malignant neoplasm of colon: Secondary | ICD-10-CM

## 2024-11-20 NOTE — Progress Notes (Signed)
 " Established patient visit  Patient: Alexis Patel   DOB: 07/15/1966   59 y.o. Female  MRN: [id number] Visit Date: 11/20/2024  Today's healthcare provider: Jolynn Spencer, PA-C   Chief Complaint  Patient presents with   Medical Management of Chronic Issues   Subjective       Discussed the use of AI scribe software for clinical note transcription with the patient, who gave verbal consent to proceed.  History of Present Illness Alexis Patel is a 59 year old female with diabetes and suspected neuritis who presents for follow-up.  She followed up after neurology where neuritis was suspected, possibly related to diabetes treatment. Gabapentin  relieved her symptoms but caused weight gain. She adjusted the dose from 200 mg three times daily to 100 mg twice daily in the morning and afternoon and 200 mg at night, and she currently has no pain on this regimen.  Her home blood pressure is usually about 123 mmHg, though it was 133 mmHg in clinic today. She manages cholesterol with diet and exercise, including treadmill and strength training at [employer].  Her diabetes is treated with Trulicity  and glipizide . Fasting blood sugars are usually around 100 mg/dL, with some fluctuation. She is worried that weight gain may worsen her A1c.  Her thyroid  levels have not been checked in two years. A prior ultrasound confirmed thyroiditis. There is a strong family history of thyroid  problems in her sisters, but no known thyroid  cancer.  She denies vision problems, chest pressure, or swelling. She had a diabetic eye exam in May. There is also thyroid  disease in her niece, who is awaiting a biopsy.       11/20/2024    9:04 AM 03/09/2024    8:28 AM 01/07/2024    9:02 AM  Depression screen PHQ 2/9  Decreased Interest 0 0 0  Down, Depressed, Hopeless 0 0 0  PHQ - 2 Score 0 0 0  Altered sleeping 0 2   Tired, decreased energy 0 0   Change in appetite 0 0   Feeling bad or failure about yourself  0 0    Trouble concentrating 0 0   Moving slowly or fidgety/restless 0 0   Suicidal thoughts 0 0   PHQ-9 Score 0 2    Difficult doing work/chores  Not difficult at all      Data saved with a previous flowsheet row definition      11/20/2024    9:04 AM 03/09/2024    8:28 AM 12/16/2023   10:27 AM  GAD 7 : Generalized Anxiety Score  Nervous, Anxious, on Edge 0 0 0  Control/stop worrying 0 0 0  Worry too much - different things 0 0 0  Trouble relaxing 0 0 0  Restless 0 0 0  Easily annoyed or irritable 0 0 0  Afraid - awful might happen 0 0 0  Total GAD 7 Score 0 0 0  Anxiety Difficulty  Not difficult at all     Medications: Show/hide medication list[1]  Review of Systems All negative Except see HPI       Objective    BP 133/82   Pulse 72   Resp 14   Ht 5' 3 (1.6 m)   Wt 188 lb 12.8 oz (85.6 kg)   LMP 11/28/2018   SpO2 99%   BMI 33.44 kg/m     Physical Exam Vitals reviewed.  Constitutional:      General: She is not in acute distress.    Appearance: Normal  appearance. She is well-developed. She is not diaphoretic.  HENT:     Head: Normocephalic and atraumatic.  Eyes:     General: No scleral icterus.    Conjunctiva/sclera: Conjunctivae normal.  Neck:     Thyroid : No thyromegaly.  Cardiovascular:     Rate and Rhythm: Normal rate and regular rhythm.     Pulses: Normal pulses.     Heart sounds: Normal heart sounds. No murmur heard. Pulmonary:     Effort: Pulmonary effort is normal. No respiratory distress.     Breath sounds: Normal breath sounds. No wheezing, rhonchi or rales.  Musculoskeletal:     Cervical back: Neck supple.     Right lower leg: No edema.     Left lower leg: No edema.  Lymphadenopathy:     Cervical: No cervical adenopathy.  Skin:    General: Skin is warm and dry.     Findings: No rash.  Neurological:     Mental Status: She is alert and oriented to person, place, and time. Mental status is at baseline.  Psychiatric:        Mood and Affect:  Mood normal.        Behavior: Behavior normal.      No results found for any visits on 11/20/24.       Assessment & Plan Type 2 diabetes mellitus complicated by diabetic neuropathy Diabetic neuropathy likely due to diabetes management. Gabapentin  effective but causing weight gain. Blood sugar well-controlled; A1c may have increased. - Continue diabetes management with Trulicity  and glipizid screen e. - Monitor blood sugar and A1c periodically. - Wean off gabapentin  as tolerated. - Schedule annual diabetic eye exam.  Hashimoto thyroiditis Chronic Previous ultrasound confirmed diagnosis. Thyroid  function tests normal in February 2025. - Ordered thyroid  function tests today. - Consider endocrinology referral if thyroid  levels abnormal,Worsening of symptoms, goiter. Will follow-up  Primary hypertension Chronic and unstable  Home blood pressure generally 123 mmHg; today's reading 133 mmHg. Current management adequate. Continue current medication regimen Continue lifestyle modifications Will follow-up  Hyperlipidemia Chronic and stable  management focuses on diet and exercise. Engaging in regular physical activity. - Continue current diet and exercise regimen. Will follow-up  1. Hypertension associated with diabetes (HCC) (Primary)  - Lipid Panel With LDL/HDL Ratio - Comprehensive metabolic panel with GFR - CBC with Differential/Platelet  2. Hashimoto thyroiditis  - TSH - T4, free  3. Diabetes mellitus without complication (HCC)  - Hemoglobin A1c - Comprehensive metabolic panel with GFR  4. Hyperlipidemia associated with type 2 diabetes mellitus (HCC)  - Lipid Panel With LDL/HDL Ratio - Comprehensive metabolic panel with GFR  5. Colon cancer screening  - Ambulatory referral to Gastroenterology  Orders Placed This Encounter  Procedures   Hemoglobin A1c   Lipid Panel With LDL/HDL Ratio   Comprehensive metabolic panel with GFR   CBC with Differential/Platelet    TSH   T4, free   Ambulatory referral to Gastroenterology    Referral Priority:   Routine    Referral Type:   Consultation    Referral Reason:   Specialty Services Required    Number of Visits Requested:   1    Return for chronic disease f/u in 3-4 mo.   The patient was advised to call back or seek an in-person evaluation if the symptoms worsen or if the condition fails to improve as anticipated.  I discussed the assessment and treatment plan with the patient. The patient was provided an opportunity to ask questions and  all were answered. The patient agreed with the plan and demonstrated an understanding of the instructions.  I, Alyx Mcguirk, PA-C have reviewed all documentation for this visit. The documentation on 11/20/2024  for the exam, diagnosis, procedures, and orders are all accurate and complete.  Jolynn Spencer, [facility], MMS [facility] [phone] (phone) [phone] (fax)  [facility]    [1]  Outpatient Medications Prior to Visit  Medication Sig   B Complex Vitamins (B COMPLEX PO) Take by mouth.   Cholecalciferol (VITAMIN D-3 PO) Take by mouth.   Continuous Glucose Sensor (FREESTYLE LIBRE 3 PLUS SENSOR) MISC Change sensor every 15 days.   empagliflozin  (JARDIANCE ) 10 MG TABS tablet Take 1 tablet (10 mg total) by mouth daily.   estradiol  (VIVELLE -DOT) 0.05 MG/24HR patch APPLY 1 PATCH ON SKIN TWICE WEEKLY   fluticasone (FLONASE) 50 MCG/ACT nasal spray Place 1 spray into both nostrils daily.   glipiZIDE  (GLUCOTROL ) 10 MG tablet Take 1 tablet (10 mg total) by mouth 2 (two) times daily before a meal.   liothyronine  (CYTOMEL ) 5 MCG tablet TAKE 1 TABLET(5 MCG) BY MOUTH DAILY   Magnesium 125 MG CAPS Take by mouth.   Multiple Vitamin (MULTIVITAMIN PO) Take by mouth.   progesterone  (PROMETRIUM ) 100 MG capsule Take 1 capsule (100 mg total) by mouth at bedtime.   rosuvastatin  (CRESTOR ) 5 MG tablet TAKE 1 TABLET(5 MG) BY MOUTH DAILY   TRULICITY  0.75  MG/0.5ML SOAJ ADMINISTER 0.75 MG UNDER THE SKIN 1 TIME A WEEK   No facility-administered medications prior to visit.   "

## 2024-11-21 LAB — COMPREHENSIVE METABOLIC PANEL WITH GFR
ALT: 30 IU/L (ref 0–32)
AST: 18 IU/L (ref 0–40)
Albumin: 4.3 g/dL (ref 3.8–4.9)
Alkaline Phosphatase: 110 IU/L (ref 49–135)
BUN/Creatinine Ratio: 25 (ref 12–28)
BUN: 16 mg/dL (ref 8–27)
Bilirubin Total: 0.4 mg/dL (ref 0.0–1.2)
CO2: 22 mmol/L (ref 20–29)
Calcium: 9.8 mg/dL (ref 8.7–10.3)
Chloride: 102 mmol/L (ref 96–106)
Creatinine, Ser: 0.63 mg/dL (ref 0.57–1.00)
Globulin, Total: 3.2 g/dL (ref 1.5–4.5)
Glucose: 100 mg/dL — ABNORMAL HIGH (ref 70–99)
Potassium: 4.7 mmol/L (ref 3.5–5.2)
Sodium: 137 mmol/L (ref 134–144)
Total Protein: 7.5 g/dL (ref 6.0–8.5)
eGFR: 101 mL/min/1.73

## 2024-11-21 LAB — CBC WITH DIFFERENTIAL/PLATELET
Basophils Absolute: 0.1 x10E3/uL (ref 0.0–0.2)
Basos: 2 %
EOS (ABSOLUTE): 0.3 x10E3/uL (ref 0.0–0.4)
Eos: 6 %
Hematocrit: 44.7 % (ref 34.0–46.6)
Hemoglobin: 14.6 g/dL (ref 11.1–15.9)
Immature Grans (Abs): 0 x10E3/uL (ref 0.0–0.1)
Immature Granulocytes: 0 %
Lymphocytes Absolute: 1.3 x10E3/uL (ref 0.7–3.1)
Lymphs: 21 %
MCH: 29.7 pg (ref 26.6–33.0)
MCHC: 32.7 g/dL (ref 31.5–35.7)
MCV: 91 fL (ref 79–97)
Monocytes Absolute: 0.4 x10E3/uL (ref 0.1–0.9)
Monocytes: 7 %
Neutrophils Absolute: 3.8 x10E3/uL (ref 1.4–7.0)
Neutrophils: 64 %
Platelets: 259 x10E3/uL (ref 150–450)
RBC: 4.92 x10E6/uL (ref 3.77–5.28)
RDW: 12.6 % (ref 11.7–15.4)
WBC: 5.9 x10E3/uL (ref 3.4–10.8)

## 2024-11-21 LAB — LIPID PANEL WITH LDL/HDL RATIO
Cholesterol, Total: 239 mg/dL — ABNORMAL HIGH (ref 100–199)
HDL: 65 mg/dL
LDL Chol Calc (NIH): 157 mg/dL — ABNORMAL HIGH (ref 0–99)
LDL/HDL Ratio: 2.4 ratio (ref 0.0–3.2)
Triglycerides: 99 mg/dL (ref 0–149)
VLDL Cholesterol Cal: 17 mg/dL (ref 5–40)

## 2024-11-21 LAB — HEMOGLOBIN A1C
Est. average glucose Bld gHb Est-mCnc: 103 mg/dL
Hgb A1c MFr Bld: 5.2 % (ref 4.8–5.6)

## 2024-11-21 LAB — TSH: TSH: 2.42 u[IU]/mL (ref 0.450–4.500)

## 2024-11-21 LAB — T4, FREE: Free T4: 1.29 ng/dL (ref 0.82–1.77)

## 2024-11-22 ENCOUNTER — Other Ambulatory Visit: Payer: Self-pay | Admitting: Physician Assistant

## 2024-11-22 DIAGNOSIS — E119 Type 2 diabetes mellitus without complications: Secondary | ICD-10-CM

## 2024-11-24 ENCOUNTER — Ambulatory Visit: Payer: Self-pay | Admitting: Physician Assistant

## 2024-12-06 ENCOUNTER — Other Ambulatory Visit: Payer: Self-pay | Admitting: Physician Assistant

## 2024-12-06 DIAGNOSIS — Z1231 Encounter for screening mammogram for malignant neoplasm of breast: Secondary | ICD-10-CM

## 2025-01-05 ENCOUNTER — Ambulatory Visit

## 2025-01-05 DIAGNOSIS — Z1231 Encounter for screening mammogram for malignant neoplasm of breast: Secondary | ICD-10-CM

## 2025-01-09 ENCOUNTER — Ambulatory Visit: Admitting: Radiology

## 2025-03-20 ENCOUNTER — Ambulatory Visit: Admitting: Physician Assistant
